# Patient Record
Sex: Female | Born: 1970 | Race: Black or African American | Hispanic: No | Marital: Single | Smoking: Current every day smoker
Health system: Southern US, Community
[De-identification: ages and names within clinical notes are randomized; demographics above are authoritative.]

## PROBLEM LIST (undated history)

## (undated) ENCOUNTER — Inpatient Hospital Stay (HOSPITAL_COMMUNITY): Payer: Self-pay

## (undated) DIAGNOSIS — O903 Peripartum cardiomyopathy: Secondary | ICD-10-CM

## (undated) DIAGNOSIS — D649 Anemia, unspecified: Secondary | ICD-10-CM

## (undated) DIAGNOSIS — H30892 Other chorioretinal inflammations, left eye: Secondary | ICD-10-CM

## (undated) DIAGNOSIS — B0052 Herpesviral keratitis: Secondary | ICD-10-CM

## (undated) DIAGNOSIS — I10 Essential (primary) hypertension: Secondary | ICD-10-CM

## (undated) HISTORY — PX: CERVICAL CERCLAGE: SHX1329

## (undated) HISTORY — PX: NO PAST SURGERIES: SHX2092

## (undated) HISTORY — PX: GASTRECTOMY: SHX58

---

## 1998-02-12 ENCOUNTER — Inpatient Hospital Stay (HOSPITAL_COMMUNITY): Admission: AD | Admit: 1998-02-12 | Discharge: 1998-02-12 | Payer: Self-pay | Admitting: *Deleted

## 1999-01-26 ENCOUNTER — Emergency Department (HOSPITAL_COMMUNITY): Admission: EM | Admit: 1999-01-26 | Discharge: 1999-01-26 | Payer: Self-pay | Admitting: Emergency Medicine

## 1999-02-10 ENCOUNTER — Other Ambulatory Visit: Admission: RE | Admit: 1999-02-10 | Discharge: 1999-02-10 | Payer: Self-pay | Admitting: Family Medicine

## 1999-08-11 ENCOUNTER — Other Ambulatory Visit: Admission: RE | Admit: 1999-08-11 | Discharge: 1999-08-11 | Payer: Self-pay | Admitting: Family Medicine

## 1999-09-28 ENCOUNTER — Emergency Department (HOSPITAL_COMMUNITY): Admission: EM | Admit: 1999-09-28 | Discharge: 1999-09-28 | Payer: Self-pay

## 1999-11-13 ENCOUNTER — Emergency Department (HOSPITAL_COMMUNITY): Admission: EM | Admit: 1999-11-13 | Discharge: 1999-11-13 | Payer: Self-pay | Admitting: Emergency Medicine

## 1999-11-13 ENCOUNTER — Encounter: Payer: Self-pay | Admitting: Emergency Medicine

## 2000-02-16 ENCOUNTER — Other Ambulatory Visit: Admission: RE | Admit: 2000-02-16 | Discharge: 2000-02-16 | Payer: Self-pay | Admitting: Family Medicine

## 2000-07-02 ENCOUNTER — Inpatient Hospital Stay (HOSPITAL_COMMUNITY): Admission: AD | Admit: 2000-07-02 | Discharge: 2000-07-02 | Payer: Self-pay | Admitting: Obstetrics & Gynecology

## 2000-07-05 ENCOUNTER — Inpatient Hospital Stay (HOSPITAL_COMMUNITY): Admission: AD | Admit: 2000-07-05 | Discharge: 2000-07-05 | Payer: Self-pay | Admitting: Obstetrics & Gynecology

## 2000-07-05 ENCOUNTER — Encounter: Payer: Self-pay | Admitting: Obstetrics

## 2000-07-26 ENCOUNTER — Inpatient Hospital Stay (HOSPITAL_COMMUNITY): Admission: AD | Admit: 2000-07-26 | Discharge: 2000-07-26 | Payer: Self-pay | Admitting: Obstetrics

## 2000-07-28 ENCOUNTER — Other Ambulatory Visit: Admission: RE | Admit: 2000-07-28 | Discharge: 2000-07-28 | Payer: Self-pay | Admitting: Obstetrics and Gynecology

## 2000-08-23 ENCOUNTER — Observation Stay (HOSPITAL_COMMUNITY): Admission: RE | Admit: 2000-08-23 | Discharge: 2000-08-23 | Payer: Self-pay | Admitting: Obstetrics and Gynecology

## 2000-08-29 ENCOUNTER — Encounter: Payer: Self-pay | Admitting: Obstetrics and Gynecology

## 2000-08-29 ENCOUNTER — Inpatient Hospital Stay (HOSPITAL_COMMUNITY): Admission: AD | Admit: 2000-08-29 | Discharge: 2000-08-29 | Payer: Self-pay | Admitting: Obstetrics and Gynecology

## 2000-10-24 ENCOUNTER — Inpatient Hospital Stay (HOSPITAL_COMMUNITY): Admission: AD | Admit: 2000-10-24 | Discharge: 2000-10-24 | Payer: Self-pay | Admitting: Obstetrics and Gynecology

## 2000-11-07 ENCOUNTER — Inpatient Hospital Stay (HOSPITAL_COMMUNITY): Admission: AD | Admit: 2000-11-07 | Discharge: 2000-11-07 | Payer: Self-pay | Admitting: Obstetrics and Gynecology

## 2000-11-25 ENCOUNTER — Encounter: Payer: Self-pay | Admitting: Obstetrics and Gynecology

## 2000-11-25 ENCOUNTER — Inpatient Hospital Stay (HOSPITAL_COMMUNITY): Admission: AD | Admit: 2000-11-25 | Discharge: 2000-11-26 | Payer: Self-pay | Admitting: Obstetrics and Gynecology

## 2000-11-26 ENCOUNTER — Encounter: Payer: Self-pay | Admitting: Obstetrics and Gynecology

## 2000-12-02 ENCOUNTER — Inpatient Hospital Stay (HOSPITAL_COMMUNITY): Admission: AD | Admit: 2000-12-02 | Discharge: 2000-12-02 | Payer: Self-pay | Admitting: Obstetrics and Gynecology

## 2000-12-26 ENCOUNTER — Inpatient Hospital Stay (HOSPITAL_COMMUNITY): Admission: AD | Admit: 2000-12-26 | Discharge: 2000-12-26 | Payer: Self-pay | Admitting: Obstetrics and Gynecology

## 2001-02-10 ENCOUNTER — Inpatient Hospital Stay (HOSPITAL_COMMUNITY): Admission: AD | Admit: 2001-02-10 | Discharge: 2001-02-10 | Payer: Self-pay | Admitting: Obstetrics and Gynecology

## 2001-02-21 ENCOUNTER — Inpatient Hospital Stay (HOSPITAL_COMMUNITY): Admission: AD | Admit: 2001-02-21 | Discharge: 2001-02-23 | Payer: Self-pay | Admitting: Obstetrics and Gynecology

## 2001-02-27 ENCOUNTER — Inpatient Hospital Stay (HOSPITAL_COMMUNITY): Admission: AD | Admit: 2001-02-27 | Discharge: 2001-03-02 | Payer: Self-pay | Admitting: Obstetrics and Gynecology

## 2001-02-27 ENCOUNTER — Encounter: Payer: Self-pay | Admitting: Obstetrics and Gynecology

## 2001-06-19 ENCOUNTER — Emergency Department (HOSPITAL_COMMUNITY): Admission: EM | Admit: 2001-06-19 | Discharge: 2001-06-19 | Payer: Self-pay | Admitting: Emergency Medicine

## 2001-06-19 ENCOUNTER — Encounter: Payer: Self-pay | Admitting: Emergency Medicine

## 2004-11-12 ENCOUNTER — Emergency Department (HOSPITAL_COMMUNITY): Admission: EM | Admit: 2004-11-12 | Discharge: 2004-11-12 | Payer: Self-pay | Admitting: Emergency Medicine

## 2005-05-18 ENCOUNTER — Inpatient Hospital Stay (HOSPITAL_COMMUNITY): Admission: AD | Admit: 2005-05-18 | Discharge: 2005-05-18 | Payer: Self-pay | Admitting: Obstetrics and Gynecology

## 2005-09-19 ENCOUNTER — Emergency Department (HOSPITAL_COMMUNITY): Admission: EM | Admit: 2005-09-19 | Discharge: 2005-09-19 | Payer: Self-pay | Admitting: Emergency Medicine

## 2005-10-19 ENCOUNTER — Other Ambulatory Visit: Admission: RE | Admit: 2005-10-19 | Discharge: 2005-10-19 | Payer: Self-pay | Admitting: Obstetrics and Gynecology

## 2005-11-29 ENCOUNTER — Inpatient Hospital Stay (HOSPITAL_COMMUNITY): Admission: AD | Admit: 2005-11-29 | Discharge: 2005-11-29 | Payer: Self-pay | Admitting: Obstetrics and Gynecology

## 2005-12-04 ENCOUNTER — Observation Stay (HOSPITAL_COMMUNITY): Admission: RE | Admit: 2005-12-04 | Discharge: 2005-12-04 | Payer: Self-pay | Admitting: Obstetrics and Gynecology

## 2006-04-22 ENCOUNTER — Inpatient Hospital Stay (HOSPITAL_COMMUNITY): Admission: AD | Admit: 2006-04-22 | Discharge: 2006-04-22 | Payer: Self-pay | Admitting: Obstetrics and Gynecology

## 2006-05-07 ENCOUNTER — Inpatient Hospital Stay (HOSPITAL_COMMUNITY): Admission: AD | Admit: 2006-05-07 | Discharge: 2006-05-10 | Payer: Self-pay | Admitting: Obstetrics and Gynecology

## 2006-05-21 ENCOUNTER — Inpatient Hospital Stay (HOSPITAL_COMMUNITY): Admission: AD | Admit: 2006-05-21 | Discharge: 2006-05-23 | Payer: Self-pay | Admitting: Obstetrics and Gynecology

## 2007-03-15 ENCOUNTER — Emergency Department (HOSPITAL_COMMUNITY): Admission: EM | Admit: 2007-03-15 | Discharge: 2007-03-15 | Payer: Self-pay | Admitting: *Deleted

## 2007-04-08 ENCOUNTER — Ambulatory Visit: Payer: Self-pay | Admitting: Unknown Physician Specialty

## 2007-04-12 ENCOUNTER — Ambulatory Visit: Payer: Self-pay | Admitting: Unknown Physician Specialty

## 2007-05-13 ENCOUNTER — Ambulatory Visit: Payer: Self-pay | Admitting: Unknown Physician Specialty

## 2007-07-13 ENCOUNTER — Encounter: Admission: RE | Admit: 2007-07-13 | Discharge: 2007-07-13 | Payer: Self-pay | Admitting: Internal Medicine

## 2008-07-27 ENCOUNTER — Encounter: Admission: RE | Admit: 2008-07-27 | Discharge: 2008-07-27 | Payer: Self-pay | Admitting: Internal Medicine

## 2009-09-16 ENCOUNTER — Encounter: Admission: RE | Admit: 2009-09-16 | Discharge: 2009-09-16 | Payer: Self-pay | Admitting: Internal Medicine

## 2010-12-07 ENCOUNTER — Emergency Department (HOSPITAL_COMMUNITY)
Admission: EM | Admit: 2010-12-07 | Discharge: 2010-12-07 | Disposition: A | Discharge status: Home / Self Care | Payer: Self-pay | Attending: Emergency Medicine | Admitting: Emergency Medicine

## 2010-12-07 DIAGNOSIS — I10 Essential (primary) hypertension: Secondary | ICD-10-CM | POA: Insufficient documentation

## 2010-12-07 DIAGNOSIS — K5289 Other specified noninfective gastroenteritis and colitis: Secondary | ICD-10-CM | POA: Insufficient documentation

## 2010-12-07 DIAGNOSIS — R000 Tachycardia, unspecified: Secondary | ICD-10-CM | POA: Insufficient documentation

## 2010-12-07 LAB — COMPREHENSIVE METABOLIC PANEL
Alkaline Phosphatase: 65 U/L (ref 39–117)
CO2: 24 mEq/L (ref 19–32)
Chloride: 102 mEq/L (ref 96–112)
Glucose, Bld: 123 mg/dL — ABNORMAL HIGH (ref 70–99)
Total Bilirubin: 0.8 mg/dL (ref 0.3–1.2)
Total Protein: 8.1 g/dL (ref 6.0–8.3)

## 2010-12-07 LAB — URINALYSIS, ROUTINE W REFLEX MICROSCOPIC
Nitrite: NEGATIVE
Urine Glucose, Fasting: NEGATIVE mg/dL

## 2010-12-07 LAB — URINE MICROSCOPIC-ADD ON

## 2010-12-07 LAB — DIFFERENTIAL
Basophils Absolute: 0 10*3/uL (ref 0.0–0.1)
Eosinophils Absolute: 0.1 10*3/uL (ref 0.0–0.7)
Eosinophils Relative: 1 % (ref 0–5)
Lymphs Abs: 0.3 10*3/uL — ABNORMAL LOW (ref 0.7–4.0)
Monocytes Absolute: 0.4 10*3/uL (ref 0.1–1.0)
Neutro Abs: 10.7 10*3/uL — ABNORMAL HIGH (ref 1.7–7.7)

## 2010-12-07 LAB — CBC
Hemoglobin: 15 g/dL (ref 12.0–15.0)
MCV: 90.1 fL (ref 78.0–100.0)
Platelets: 283 10*3/uL (ref 150–400)
RDW: 12.8 % (ref 11.5–15.5)

## 2011-02-27 NOTE — Op Note (Signed)
[facility]  Patient:    Tanya, Fry                     MRN: [id number] Proc. Date: 08/23/00 Adm. Date:  [id number] Attending:  Leonard Schwartz                           Operative Report  PREOPERATIVE DIAGNOSES:       1. A 14-week gestation.                               2. Incompetent cervix.  POSTOPERATIVE DIAGNOSES:      1. A 14-week gestation.                               2. Incompetent cervix.  OPERATION:                    McDonald cerclage.  SURGEON:                      Janine Limbo, M.D.  ASSISTANT:  ANESTHESIA:                   Spinal anesthesia.  ESTIMATED BLOOD LOSS:         25 to 30 cc.  INDICATIONS:                  [name] is a 40 year old female, gravida 3, para 0-1-1-1, who presents at 14-weeks gestation.  She has a history of a 28-week preterm delivery and that pregnancy was complicated by an incompetent cervix.  She understands the indications for this procedure and she accepts the risks of, but not limited to, anesthetic complications, bleeding, infections, and possible damage to surrounding organs.  FINDINGS:                     An ultrasound was obtained by me which showed a single intrauterine gestation in a breech presentation.  The fetal heart rate was 148 beats per minute.  The amniotic fluid volume was normal.  The placenta was posterior. The cervix was closed and long.  The patients blood type is O positive.  DESCRIPTION OF PROCEDURE:     The patient was taken to the operating room where a spinal anesthetic was given.  The perineum and vagina were prepped with multiple layers of Hibiclens.  The patient had voided 10 minutes prior to coming to the operating room.  The patient was sterilely draped.  Examination under anesthesia was performed.  A speculum was placed in the vagina.  A circumferential stitch of 5 mm Mersilene was placed around the cervix at the level of the internal os.   Care was taken not to damage the gestational sac. The knot was tied posteriorly.  The patient tolerated the procedure well.  The estimated blood loss was 25 to 30 cc.  The patient was returned to the supine position and then taken to the recovery room in stable condition.  Sponge, needle and instrument counts were correct.  FOLLOW-UP INSTRUCTIONS:       The patient was given terbutaline 0.25 mg subcutaneous prior to surgery.  She will receive ibuprofen for at least one week postoperatively.  She will be observed in the hospital and then discharged  to home later. DD:  08/23/00 TD:  08/23/00 Job: [id number]

## 2011-02-27 NOTE — H&P (Signed)
Tanya Fry, Tanya Fry              ACCOUNT NO.:  [id number]   MEDICAL RECORD NO.:  [id number]           PATIENT TYPE:   LOCATION:                                FACILITY:  [facility]   PHYSICIAN:  Janine Limbo, M.D.DATE OF BIRTH:  Feb 15, 1971   DATE OF ADMISSION:  05/07/2006  DATE OF DISCHARGE:                                HISTORY & PHYSICAL   HISTORY OF PRESENT ILLNESS:  Tanya Fry is a 40 year old female, gravida  6, para 1-1-3-2, who presents at [redacted] weeks gestation (EDC is May 14, 2006).  The patient has been followed at the [facility] and  Gynecology Division of [facility].  This pregnancy has  been complicated by the fact that she has had a pre-term delivery at 28  weeks in the past.  She has also been diagnosed with an incompetent cervix.  A cerclage was placed during the pregnancy and was removed at [redacted] weeks  gestation.  The patient also has a history of a postpartum cardiomyopathy.  She has been followed closely and has done very well during this pregnancy.  She has been evaluated by a cardiologist and her most recent echo was  thought to be within normal limits.  The patient has had an elevation of her  blood pressure in the late third trimester.  Her non-stress tests have been  reactive.  The patient has a history of postpartum depression.  She desires  permanent sterilization.  She is greater than age 11 but declined  amniocentesis.   OBSTETRICAL HISTORY:  The patient had a pre-term delivery in 1992 at [redacted]  weeks gestation.  In 2002, the patient had a term vaginal delivery.  A  cerclage was placed during that pregnancy.  She was noted to have a  postpartum cardiomyopathy.  The patient had a miscarriage in 2004.  She had  an elective pregnancy termination in 2006.   PAST MEDICAL HISTORY:  Please see history of present illness.   ALLERGIES:  No known drug allergies.  She is allergic to shellfish.   REVIEW OF SYSTEMS:  Normal  pregnancy complaints.   SOCIAL HISTORY:  The patient denies cigarette use, alcohol use, and  recreational drug use.   FAMILY HISTORY:  Noncontributory.   PHYSICAL EXAMINATION:  VITAL SIGNS:  Weight 249 pounds.  HEENT:  Within normal limits.  CHEST:  Clear.  HEART:  Regular rate and rhythm.  BREASTS:  Without masses.  ABDOMEN:  Nontender, the fundal height is approximately 39 cm.  EXTREMITIES:  Edematous.  NEUROLOGICAL:  Grossly normal.  PELVIC EXAM:  Cervix is fingertip dilated, 50% effaced, and -2 in station.   LABORATORY DATA:  Blood type O positive, antibody screen negative, sickle  cell negative, VDRL nonreactive, Rubella immune, HBSAG negative, third  trimester beta Strep is negative.   ASSESSMENT:  1.  [redacted] weeks gestation.  2.  History of an incompetent cervix.  3.  History of postpartum cardiomyopathy.  4.  Late third trimester elevation in blood pressure.  5.  Desires permanent sterilization.  6.  History of postpartum depression.  7.  History of pre-term delivery.   PLAN:  The patient will undergo induction of labor.  The patient also  desires permanent sterilization.  We will follow the patient carefully.  We  will have cardiology consult with this patient so that we can properly  manage her postpartum fluid balance.      Janine Limbo, M.D.  Electronically Signed     AVS/MEDQ  D:  05/06/2006  T:  05/06/2006  Job:  [id number]   cc:   Armanda Magic, M.D.  Fax: [phone]

## 2011-02-27 NOTE — H&P (Signed)
[facility]  Patient:    Tanya Fry, Tanya Fry                     MRN: [id number] Adm. Date:  [id number] Disc. Date: [id number] Attending:  Leonard Schwartz Dictator:   Nigel Bridgeman, C.N.M.                         History and Physical  HISTORY:                      Tanya Fry is a 40 year old, gravida 3, para 0-1-1-1 at 52 6/7 weeks who presented from the office with shortness of breath, fatigue, 10 pound weight gain in one week, dizziness and palpitations. She also reports the symptoms have been ongoing but the last few weeks have been worsening in terms of the fatigue and the shortness of breath over the last few days. She denies any cramping or bleeding. She reports positive fetal movement. She does report she has been treated for bacterial vaginosis but now feels that she is developing a yeast infection. She denies any recent illnesses or exposures. The pregnancy has been remarkable for: 1) A cerclage in place secondary to a history of previous preterm delivery at 28 weeks.  2) History of incompetent cervix. 3) Positive group B strep.  PRENATAL LABS:                Blood type is O+, Rh antibody negative. VDRL nonreactive. Rubella titer positive. Hepatitis B surface antigen negative. Hemoglobin upon entering the practice was 11.5 with platelet count of 313. EDC of Feb 24, 2001 was established by last menstrual period and was in agreement with ultrasound at approximately 18 weeks. Group B strep culture was positive in January.  HISTORY OF PRESENT PREGNANCY:  The patient entered care at approximately 10 weeks. She had had a history of a previous preterm delivery with an incompetent cervix at 28 weeks. A cerclage was placed at approximately 13.5-14 weeks. She did have some bleeding and cramping the week following the cerclage placement. She had a low lying placenta diagnosed on 13 week ultrasound. This resolved by ultrasound at 18 weeks. Her  cervix has been checked every visit and has remained intact. She had another ultrasound at 20 weeks which showed normal growth and fluid. She was treated for bacterial vaginosis with metrogel at 20 weeks.  OBSTETRICAL HISTORY:          In 1992, she had a vaginal birth of a female infant, weight 2 pounds 8 ounces at [redacted] weeks gestation. She was in labor 30 minutes to an hour. She had no anesthesia. She was in the hospital on magnesium sulfate at the time of her delivery and had been diagnosed with preterm labor beginning at 12 weeks. She was on terbutaline and magnesium sulfate. In 1999, she had elective termination of pregnancy between 8 and 12 weeks with no complications. On the previous pregnancy, she did have anemia which was treated with iron.  PAST MEDICAL HISTORY:         She is a previous contraceptive film user. She had an abnormal Pap approximately one year ago and had a normal colposcopy. Her Paps have been normal since. She has had occasional yeast infections and occasional bacterial vaginosis. She does have a history of anemia. She has occasional UTIs in the past. She had her wisdom teeth removed 15 years ago and had a  therapeutic termination of pregnancy in 1999. She was hospitalized in the past for an anaphylactic reaction to shellfish.  FAMILY HISTORY:               Her maternal grandfather had an MI. Her maternal grandfather and father had hypertension. The patient has occasional varicosities. Father has emphysema. Maternal grandmother had breast cancer. Father is a drug and alcohol user.  GENETIC HISTORY:              Unremarkable.  SOCIAL HISTORY:               The patient is single. The father of the baby is involved and supportive, his name is Georges Mouse. She is African-American of the [employer]. She is college educated. She is employed as a Animal nutritionist at [employer]. Her partner has two years of college, he is employed as a Paediatric nurse. She  has been followed by the physician service at [facility]. She denies any alcohol, drug, or tobacco use during this pregnancy. 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  PHYSICAL EXAMINATION:  VITAL SIGNS:                  Blood pressure is 130/183 and 120/170. Respirations are 18. O2 saturations are 98-99% on room air. The patient is afebrile. The patients able to lie flat without shortness of breath.  HEENT:                        Normal. There is no carotid bruit or distention noted.  HEART:                        Regular rate with a very mild occasional irregular rhythm.  ABDOMEN:                      Gravid and nontender with fundal height approximately 26-27 weeks. Electronic fetal monitoring revealed fetal heart rate is reactive with occasional and sporadic mild variables. There are 1-2 contractions and approximately 2 hours of tracing. These were mild and the patient was unaware of them.  PELVIC:                       Deferred at this time.  EXTREMITIES:                  Deep tendon reflexes are 3+ with one beat of clonus bilaterally. There is also 2+ edema noted in the lower extremities.  LABORATORY DATA:              CBC, hemoglobin is 8.5, hematocrit 23.9, white blood cell count 8.5 and platelets of 183. Comprehensive metabolic shows SGOT of 51, SGPT 67, sodium 134, potassium 3.8, BUN 5, creatinine 0.6. TSH is pending. Urinalysis at the office showed a trace protein, negative ketones. EKG done shows normal sinus rhythm with a sinus arrhythmia. There is a grade 3-4 systolic murmur noted. Negative CVA tenderness noted.  IMPRESSION:                    1. Intrauterine pregnancy at 25 6/7 weeks.                                2. Cardiac issue versus preeclampsia variant.  3. Anemia.  PLAN:                          1. Admit to ______ unit for 23 hour                                   observation per consult  with Dr. Marline Backbone as attending physician.                                2. Continuous electronic fetal monitoring.                                 3. Plan repeat CBC and complete metabolic                                   profile in the morning.                                4. Dr. Stefano Gaul or Dr. Pennie Rushing will pursue                                   cardiology consult in the morning.                                5. M.D.s will follow. DD:  11/24/00 TD:  11/24/00 Job: [id number]

## 2011-02-27 NOTE — Op Note (Signed)
[name], Tanya Fry              ACCOUNT NO.:  [id number]   MEDICAL RECORD NO.:  [id number]          PATIENT TYPE:  OBV   LOCATION:  9307                          FACILITY:  [facility]   PHYSICIAN:  Janine Limbo, M.D.DATE OF BIRTH:  10/15/1970   DATE OF PROCEDURE:  12/04/2005  DATE OF DISCHARGE:  12/04/2005                                 OPERATIVE REPORT   PREOPERATIVE DIAGNOSES:  1.  Gestation at 17 weeks.  2.  Incompetent cervix.   POSTOPERATIVE DIAGNOSES:  1.  Gestation at 17 weeks.  2.  Incompetent cervix.   PROCEDURE:  McDonald cerclage.   SURGEON:  Leonard Schwartz, M.D.   ANESTHETIC:  Spinal.   DISPOSITION:  Tanya Fry is a 39 year old female, gravida 6, para 1-1-3-  2, who presents with the above diagnoses.  She understands the indications  for her surgical procedure, and she accepts the risks of, but not limited  to, anesthetic complications, bleeding, infection, and possible damage to  surrounding organs.   FINDINGS:  The patient was noted to have an 18-week size uterus.  The cervix  was long and closed. The patient's blood type is O+.   DESCRIPTION OF PROCEDURE:  The patient was taken to the operating room where  a spinal anesthetic was given.  The patient's perineum and vagina were  prepped with multiple layers of Hibiclens.  The bladder was drained of  urine.  The patient was then sterilely draped.  The cervix was injected and  was examined.  We then used a #5 Mersilene stitch to make a cerclage around  the cervix with a knot tied posteriorly.  The patient tolerated her  procedure well. The estimated blood loss was 5 mL.  There was no evidence of  damage to the membranes. The cervix was noted to be closed and firm at the  end of our procedure.  The patient tolerated her procedure well.   FOLLOW-UP INSTRUCTIONS:  The patient will be observed in the hospital for  approximately 5-6 hours.  She will take ibuprofen at home 600 mg every 6  hours for the  next 4 days.  She was then use ibuprofen as needed.  She  was also given a prescription for Vicodin and she will take 1 or 2 tablets  every 4 hours as needed for pain.  The patient will call for questions or  concerns.  She will call if there is any evidence of excessive bleeding or  leaking of fluid.  She will return to see Dr.  Stefano Gaul in two weeks for  followup examination in the office or as needed.      Janine Limbo, M.D.  Electronically Signed     AVS/MEDQ  D:  12/04/2005  T:  12/05/2005  Job:  [id number]
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
## 2011-02-27 NOTE — H&P (Signed)
NAMEODESSER, TOURANGEAU              ACCOUNT NO.:  [id number]   MEDICAL RECORD NO.:  [id number]          PATIENT TYPE:  INP   LOCATION:  9157                          FACILITY:  [facility]   PHYSICIAN:  Osborn Coho, M.D.   DATE OF BIRTH:  08/02/1971   DATE OF ADMISSION:  05/21/2006  DATE OF DISCHARGE:                                HISTORY & PHYSICAL   [name] is a 40 year old gravida 6, para 2-1-3-3 who was admitted at  61 days postpartum with elevated blood pressures and headache.  Patient with  history of increased blood pressure during pregnancy; however, no evidence  of preeclampsia.  Patient was seen several times during the week prior to  admission for treatment of headache at her primary care Tanya Fry, at which  time her blood pressure was noted to be elevated.  Patient was also seen at  [facility] on the day of admission and noted to have elevated blood  pressures of 170s over 100s.  Patient with complaints of onset of bilateral  occipital headache on Saturday, May 15, 2006.  Patient reports the  headache as moderate.  Patient reports she has been using Motrin and Tylox  with minimal to moderate relief of her headache.  Patient also reports some  slightly blurry vision with this headache.  Patient reports positive  photophobia but no phonophobia.  Patient denies nausea and vomiting.  Patient denies any right upper quadrant pain.  Patient denies increased  edema, reports that her edema has decreased.  Patient Denies chest or leg  pain, shortness of breath.  Patient reports normal lochia.  Patient denies  perineal pain.  Patient denies any fever or UTI symptoms.  Patient was seen  by [facility] today and reports that she had a normal echo with an  ejection fraction of approximately 50%.  Patient with no previous history of  migraines.  Patient is admitted for magnesium sulfate for seizure  prophylaxis, as well as 24-hour urine and further observation.   CURRENT  MEDICATIONS:  1. Lasix 20 mg daily.  2. Prenatal vitamins, one daily.  3. Motrin 600 mg every six hours.   ALLERGIES:  NO KNOWN DRUG ALLERGIES.   PAST OB HISTORY:  1. Pregnancy #1, spontaneous vaginal delivery at 28 weeks, incompetent      cervix.  2. Pregnancy #2, elective interruption of pregnancy.  3. Pregnancy #3, spontaneous vaginal delivery at 40 weeks, complicated by      postpartum cardiomyopathy and cerclage.  4. Spontaneous AB, no complications.  5. Elective interruption of pregnancy.  6. Spontaneous vaginal delivery at 39 weeks with cerclage and increased      blood pressure.   GYN HISTORY:  Patient with a history of abnormal Pap six years ago, status  post culpo.  No abnormal Pap since.  History of chlamydia in the past.   PAST MEDICAL HISTORY:  1. Significant for postpartum cardiomyopathy.  No evidence of      cardiomyopathy on echo done prior to admission.  2. History of postpartum depression, no current signs and symptoms.   SURGERIES:  D&C x2, cerclage x2.  HOSPITALIZATIONS:  Birth x3.   FAMILY HISTORY:  Father with cardiomyopathy, coronary artery disease, COPD.  Paternal grandmother:  Coronary artery disease.  Paternal grandfather:  Coronary artery disease.  Maternal grandmother:  Breast cancer.   SOCIAL HISTORY:  Patient denies use of alcohol, tobacco or street drugs.  Patient's domestic violence screen is negative.   PHYSICAL EXAMINATION:  VITAL SIGNS:  Blood pressures on admission to MAU  170/85, 148/84, 155/75, 141/74.  Patient is afebrile.  Other vital signs are  stable.  Patient is in no apparent distress.  SKIN:  Warm and dry.  Color is satisfactory.  Patient is alert and oriented.  HEENT:  Within normal limits.  NECK:  With no JVD, negative lymphadenopathy.  LUNGS:  Clear.  HEART:  Regular rate and rhythm.  BREASTS:  Soft.  BACK:  Negative CVA tenderness.  ABDOMEN:  Soft, nontender with positive bowel sounds in all four quadrants.  Uterine  fundus is firm and nontender and appropriately involuting.  PELVIC:  Deferred.  EXTREMITIES:  Trace edema noted bilaterally in lower extremities.  Deep  tendon reflexes are 1+, no clonus.  Negative Homan's sign bilaterally.  SKIN:  Intact.  WEIGHT:  236-1/2 pounds on admission today.   ADMISSION LABS:  Clean-catch urine, specific gravity 1.010, negative  protein, small blood, small leukocyte esterase.  CBC:  WBCs 4.9, hemoglobin  12.1, hematocrit 35.9, platelets 342,000.   METABOLIC LABS:  Creatinine 1.1, BUN 11 and potassium 3.4, otherwise within  normal limits, SGOT 21, SGPT 20, LDH 125, uric acid 9.3.   ASSESSMENT:  1. Postpartum hypertension, rule out pre-eclampsia.  2. History of postpartum cardiomyopathy.  3. Headache.   PLAN:  Patient to be admitted to antepartum for magnesium seizure  prophylaxis per Dr. Su Hilt.  A 24-hour urine will be collected and repeat  labs in the morning.  Serial blood pressures and further observation.     Rhona Leavens, CNM      Osborn Coho, M.D.  Electronically Signed   NOS/MEDQ  D:  05/21/2006  T:  05/21/2006  Job:  [id number]

## 2011-02-27 NOTE — Discharge Summary (Signed)
Tanya Fry, Tanya Fry              ACCOUNT NO.:  [id number]   MEDICAL RECORD NO.:  [id number]          PATIENT TYPE:  INP   LOCATION:  9117                          [facility]ILITY[facility]  [facility]   PHYSICIAN:  Hal Morales, M.D.DATE OF BIRTH:  05/14/1971   DATE OF ADMISSION:  05/07/2006  DATE OF DISCHARGE:  05/10/2006                                 DISCHARGE SUMMARY   ADMISSION DIAGNOSES:  1. Intrauterine pregnancy at 39 weeks.  2. History of incompetent cervix.  3. History of postpartum cardiomyopathy.  4. Late third trimester elevation of blood pressure.  5. Desires permanent sterilization.  6. History of postpartum depression.  7. History of preterm delivery.   DISCHARGE DIAGNOSES:  1. Term pregnancy.  2. Gestational hypertension.  3. History of postpartum cardiomyopathy.   PROCEDURES:  1. Normal spontaneous vaginal delivery.  2. Epidural anesthesia.   HOSPITAL COURSE:  The patient was scheduled for induction due to her history  of the postpartum cardiomyopathy.  Her pregnancy had been remarkable for  placement of a cerclage that was removed at 36 weeks.  Her most recent  cardiac echocardiogram had been within normal limits.  The patient had an  elevation of her blood pressure in the late third trimester, but this did  not present itself to be a problem in labor.  On admission, the cervix was  fingertip, 50% effaced, and -2.  She was brought in on the afternoon of May 07, 2006.  Low-dose Pitocin was begun.  It was then discontinued the evening  of May 07, 2006.  Cytotec was placed q.4h.  Pitocin was begun the next  morning, and the patient began to move quickly in her labor.  She was  completely dilated at 10:57 and delivered at 11:21 a viable female by the  name of Alana, Apgars 8 and 9, weight 6 pounds 13 ounces.  There were no  lacerations noted.   The patient had originally elected sterilization.  [facility] was  consulted.  The patient was seen during her hospital  stay by the  cardiologist.  Recommendations made were to keep scheduled followup in one  week with the cardiologist.  The patient was placed on HCTZ 25 mg, one p.o.  daily.  This was designed to be continued post-delivery.   By postpartum day #1, hemoglobin was 10.1, down from 11.  The patient  continued to take her tandem iron supplements.  Social work consult was  obtained secondary to history of postpartum depression.  The patient was  doing very well.   The rest of the patient's hospital course was uncomplicated.  By postpartum  day #2, she was doing well.  She was up ad lib.  She denied any shortness of  breath or chest pain.  Her weight was 244.  She had been 250 on May 07, 2006.  Extremities were within normal limits.  The patient was not appearing  to retain extra fluid.  She was tolerating a regular diet and having good  control of her pain.  She was deemed to have received full benefit from her  hospital  stay and was discharged home.  She was breast and bottle feeding.   DISCHARGE INSTRUCTIONS:  Per [facility] handout.  The patient was  also to observe for any signs and symptoms of increased fluid retention,  shortness of breath, headache, visual symptoms, or epigastric pain or any  other abnormal findings.   DISCHARGE MEDICATIONS:  1. Motrin 600 mg p.o. q.6h. p.r.n. pain.  2. Tylox one to two p.o. q.3-4h. p.r.n. pain.  3. HCTZ 25 mg one p.o. daily.   FOLLOW UP:  Discharge followup will occur in six weeks at [facility].  The patient will be seen next week by the cardiologist or p.r.n.      Renaldo Reel Emilee Hero, C.N.M.      Hal Morales, M.D.  Electronically Signed    VLL/MEDQ  D:  05/10/2006  T:  05/10/2006  Job:  [id number]

## 2011-02-27 NOTE — Discharge Summary (Signed)
[facility] of [facility]  Patient:    Tanya Fry, Tanya Fry                     MRN: [id number] Adm. Date:  [id number] Disc. Date: 11/26/00 Attending:  Leonard Schwartz Dictator:   Miguel Dibble, C.N.M.                           Discharge Summary  DATE OF BIRTH:                20-Nov-1970  ADMISSION DIAGNOSES:          Intrauterine pregnancy at 25 6/7 weeks with shortness of breath, fatigue, and systolic ejection murmur grade 3, severe anemia, incompetent cervix, low lying placenta.  DISCHARGE DIAGNOSES:          Improvement in anemia, incompetent cervix stable.  PROCEDURE:                    Electronic fetal monitoring, transfusion of 3 units packed cells, echocardiogram, ultrasound, pulse oximetry, EKG.  HOSPITAL COURSE:              On February 13 Marciana Uplinger was admitted from the office a gravida 3, para 0-1-1-1 at 42 6/7 weeks with severe dyspnea, fatigue, 10 pounds weight gain in the previous week, dizziness, palpitations. She was noted to have grade 3 systolic ejection murmur, severe 2+ edema pitting in the lower extremities.  No carotid enlargement.  Lung sounds were clear.  EKG indicated a normal sinus rhythm.  She was evaluated by the cardiologist who ruled out congestive heart failure and it was assumed that her shortness of breath was most likely due to severe anemia-hemoglobin 7.5, hematocrit 21.4, platelets 207,000, white count 7.9.  Liver enzymes were elevated.  AST was 51, ALT 67.  Hemoglobin dropped from 8.5 to 7.5. Hematocrit dropped from 23.9 to 21.4.  Vital signs were stable.  O2 saturation at 97%.  TSH 1.5.  Patient was offered risks and benefits of a blood transfusion and consented to a blood transfusions.  She received 3 units of packed cells with an improvement in her hemoglobin to 10.1 the day after her transfusion February 15.  Vital signs were stable.  Blood pressures ranged 114-120/62-70.  SGOT had decreased to normal 30,  SGPT 46 with upper limit of normal 40.  Her shortness of breath had decreased since her transfusion. Patient was encouraged to continue taking her prenatal vitamins and iron supplementation t.i.d.  She would follow-up in the office next week.  DISCHARGE INSTRUCTIONS:       Decreased activity, kick counts, antepartum instructions.  DISCHARGE MEDICATIONS:        1. Hemocyte t.i.d.                               2. Prenatal vitamins.  DISCHARGE FOLLOW-UP:          In one week at [facility]. DD:  11/26/00 TD:  11/26/00 Job: [id number]

## 2011-02-27 NOTE — H&P (Signed)
[facility] of [facility]  Patient:    Tanya Fry, Tanya Fry                       MRN: [id number] Attending:  Janine Limbo, M.D.                         History and Physical  DATE OF BIRTH:                23-Dec-1970  HISTORY OF PRESENT ILLNESS:   Ms. Kelson is a 40 year old female, gravida 3, para 0-1-1-1, who presents at [redacted] weeks gestation ([facility] Feb 24, 2001) for a cerclage.  The patient had a prior delivery at [redacted] weeks gestation preceded by a silent dilatation of her cervix.  She was told by her physician at that time that she had an incompetent cervix and that a cerclage would be appropriate.  OBSTETRICAL HISTORY:           The patient had a vaginal delivery in 1992 at [redacted] weeks gestation.  She had an elective pregnancy termination in 1999.  PAST MEDICAL HISTORY:         The patient denies hypertension and diabetes. She had her wisdom teeth removed in 1986.  She had an anaphylactic reaction to shellfish that required hospitalization.  DRUG ALLERGIES:               No known drug allergies, but the patient did have an anaphylactic reaction to SHELLFISH.  SOCIAL HISTORY:               The patient is single, and she works as a Designer, jewellery for [employer].  She denies cigarette use, alcohol use, and recreational drug use.  REVIEW OF SYSTEMS:            Normal pregnancy complaints.  FAMILY HISTORY:               The patients maternal grandmother had breast cancer.  The patients father had difficulty with drug and alcohol dependency. The patients father had chronic hypertension.  PHYSICAL EXAMINATION:   VITAL SIGNS:                 Weight 172 pounds.  HEENT:                        Within normal limits.  CHEST:                        Clear.  HEART:                        Regular rate and rhythm.  BREASTS:                      Without masses.  ABDOMEN:                      Nontender.  EXTREMITIES:                  Within normal  limits.  NEUROLOGIC:                   Normal.  PELVIC:                       Cervix closed and long.  ASSESSMENT:  1. Fourteen-week gestation.                               2. History of incompetent cervix.                               3. Shellfish allergy.  PLAN:                         The patient will undergo a McDonald cerclage. She understands the indications for her procedure, and she accepts the risk of, but not limited to, anesthetic complications, bleeding, infection, and possible damage to surrounding organs which may result in rupture of membranes and subsequent miscarriage.  We will use Hibiclens because the patient has an allergic reaction to shellfish. DD:  08/22/00 TD:  08/22/00 Job: [id number]

## 2011-02-27 NOTE — Discharge Summary (Signed)
[facility] of [facility]  Patient:    Tanya Fry, Tanya Fry                     MRN: [id number] Adm. Date:  [id number] Disc. Date: [id number] Attending:  Leonard Schwartz Dictator:   Mack Guise, C.N.M.                           Discharge Summary  HOSPITAL COURSE:  Ms. Babler is a 40 year old African-American female who presents status post normal spontaneous vaginal delivery on 02/21/01, with shortness of breath and chest pain with deep breathing.  She reports that she sweats all the time and now had dizziness and blurred vision.  She was hospitalized during her pregnancy in February with complaint of shortness of breath.  She was found to be anemic, hemoglobin 7.5, and she was transfused with four units of packed cells.  Her symptoms resolved at that time.  On exam on admission, her blood pressure remained elevated at 175/102, respirations 22, and pulse 66.  Her O2 saturations were 89 and 96%.  A cardiology consult was obtained, and a chest x-ray and EKG.  Her laboratory work found her liver functions, SGOT and SGPT, were elevated on admission at 52 and 58, increased to 85 and 124 and have decreased as of yesterday to 46 and 104.  Her other laboratory work remains stable.  Her hemoglobin was 9.2 with no proteinuria. Portable chest x-ray found cardiomegaly with mild pulmonary edema, some atelectasis in the right lung base and no evidence of pneumothorax or significant pleural effusion.  Echocardiogram indicated the left ventricle was mildly to moderately dilated, overall left ventricular systolic function was markedly to moderately decreased.  Left ventricular ejection fraction was estimated the range being 25-35%.  There was moderate diffuse left ventricular hypokinesis.  Findings were suggestive of akinesis of the mid distal anteroseptal wall.  Findings were suggestive of akinesis of the basal mid septal wall.  There was trivial aortic valvular  regurgitation, B-notch, consistent with elevated left ventricular end diastolic pressure.  There was moderate mitral valvular regurgitation and JCT was eccentric and directed posteriorly.  The left atrium was mildly dilated.  The right ventricle was mildly dilated.  The right atrium was moderately dilated.  There was a trivial effusion posterior to the heart.  The patient has been maintained since that time with cardiac medications and has improved her condition significantly. She is no longer short of breath.  Her swelling has decreased and she is feeling much better.  She is taking K-Dur 20 mEq 1 p.o. q.d., Lasix 40 mg 1 p.o. q.d., Altace 5 mg 1 p.o. q.d., and digoxin 0.125 mg 1 p.o. q.d.  She has been released from cardiology and is okay from an obstetrical standpoint.  DISCHARGE MEDICATIONS:  Will be those described above.  DISCHARGE FOLLOW-UP:  With cardiology on Tuesday, June 11, with Dr. Mayford Knife at 10:30, and routine postpartum visit at six weeks at [facility]. DD:  03/02/01 TD:  03/02/01 Job: [id number]

## 2011-02-27 NOTE — Discharge Summary (Signed)
[name], Fry              ACCOUNT NO.:  [id number]   MEDICAL RECORD NO.:  [id number]          PATIENT TYPE:  OBV   LOCATION:  9307                          [facility]ACILITY:  [facility]   PHYSICIAN:  Janine Limbo, M.D.DATE OF BIRTH:  26-Oct-1970   DATE OF ADMISSION:  12/04/2005  DATE OF DISCHARGE:  12/04/2005                                 DISCHARGE SUMMARY   DISCHARGE DIAGNOSES:  1.  Incompetent cervix.  2.  17 weeks' gestation.   OPERATION:  On the day of admission, the patient underwent a McDonald  cerclage tolerating the procedure well.   HISTORY OF PRESENT ILLNESS:  Tanya Fry is a 40 year old female, para 1-1-  3-2, with a history of an incompetent cervix who presents at 3 weeks'  gestation for placement of a cerclage.  Please see the patient's dictated  history and physical examination for details.   PREOPERATIVE PHYSICAL EXAM:  Weight 214 pounds, height 5 feet 7 inches tall.  General exam was within normal limits.  We do note that her abdominal exam  reveals a gravid abdomen.  Pelvic exam external genitalia is normal, vagina  is normal, cervix is closed and long.  Uterus is gravid and approximately 18-  20 weeks in size.  Adnexa was without masses.  The patient's blood type is  O+.   HOSPITAL COURSE:  On the day of admission, the patient underwent  aforementioned procedure tolerating it well and was without any  postoperative complications and therefore was discharged six hours after  procedure to go home.   DISCHARGE MEDICATIONS:  Vicodin 1-2 tablets every 4 hours as needed for  severe pain, ibuprofen 300 mg 2 tablets with food every 6 hours for 3 days  and then as needed for pain.   FOLLOW UP:  The patient is to call [facility] at [phone] to  schedule a two-week follow-up visit with Dr. Stefano Gaul.   DISCHARGE INSTRUCTIONS:  The patient was advised to avoid driving for one  week, heavy lifting for three to four weeks, sexual activity for two  weeks.  She should rest in bed and a chair for four days.  May shower and diet is  without restriction.  Final pathology is not applicable.     Tanya J. Tanya Fry.      Janine Limbo, M.D.  Electronically Signed   EJP/MEDQ  D:  01/07/2006  T:  01/08/2006  Job:  [id number]

## 2011-02-27 NOTE — Discharge Summary (Signed)
Tanya Fry, Tanya Fry              ACCOUNT NO.:  [id number]   MEDICAL RECORD NO.:  [id number]          PATIENT TYPE:  INP   LOCATION:  9157                          [facility]  [facility]   PHYSICIAN:  Osborn Coho, M.D.   DATE OF BIRTH:  1971/09/15   DATE OF ADMISSION:  05/21/2006  DATE OF DISCHARGE:  05/23/2006                                 DISCHARGE SUMMARY   ADMITTING DIAGNOSES:  1. Postpartum 14 days.  2. Postpartum hypertension.  3. History of postpartum cardiomyopathy.   DISCHARGE DIAGNOSES:  1. Sixteen days postpartum.  2. Pregnancy-induced hypertension.  No evidence of preeclampsia.  3. History of postpartum cardiomyopathy.   PROCEDURES:  Magnesium sulfate seizure prophylaxis.   HOSPITAL COURSE:  [name] was a 40 year old gravida 6, para 2-1-3-3  who was admitted at 15 days postpartum with elevated blood pressure and  complaints of headache.  The patient admitted with headache, elevated blood  pressure and slightly blurry vision.  However the patient denies right upper  quadrant pain.  The patient with slightly elevated blood pressures during  the last trimester of her pregnancy, however, during her postpartum stay and  recovery, no increased blood pressures were noted and PIH labs were  negative.  The patient reported that on six days prior to admission she  began developing bilateral occipital headache.  The patient was seen at  [facility] for follow up of history of postpartum cardiomyopathy and  blood pressures were noted to be 170's/110's.  Therefore patient was sent  for further evaluation.  The patient was admitted to [facility].  Admission labs revealed normal liver functions and metabolic  panel within normal limits with the exception of creatinine of 1.1 and uric  acid of 9.3.  Otherwise, the patient's labs were within normal limits.  The  patient's blood pressures upon admission noted to be 140's/170's to 70's to  100's.  The  patient was started on a course of IV magnesium sulfate for  seizure prophylaxis,  24-hour urine was obtained which returned with results  of protein, too small to calculate.  The patient was also started on  Labetalol p.o. for control of blood pressure.  Her blood pressures improved  on labetalol.  The patient underwent a CT of the head secondary to come  complaints of headache which returned with no intracranial processes.  The  patient remained stable throughout hospitalization.   On hospital day 2, it was that the patient has a received a full benefit of  her postpartum stay and was discharged home.   DISCHARGE CONDITION:  Stable.   DISCHARGE INSTRUCTIONS:  The patient to maintain routine postpartum  instructions as previously instructed at postpartum discharge.   DISCHARGE MEDICATIONS:  1. Labetalol 200 mg p.o. b.i.d.  2. Vicodin 1-2 tablets every 4-6 hours p.r.n. headache.  3. Flexeril 10 mg every 6-8 hours p.r.n. headache.  4. The patient will also continue with Lasix 20 mg daily.  5. Prenatal vitamins 1 tablet daily.  6. Motrin 600 mg p.o. p.r.n.   DISCHARGE FOLLOWUP:  1. The patient will return to [facility] in 1 week for blood      pressure follow up.  The patient will call to get scheduled      appointment.  2. The patient is to call her ophthalmologist for appointment as soon as      possible for evaluation secondary to continued blurry vision.      Rhona Leavens, CNM      Osborn Coho, M.D.  Electronically Signed    NOS/MEDQ  D:  05/23/2006  T:  05/24/2006  Job:  [id number]

## 2011-02-27 NOTE — Consult Note (Signed)
[facility] of [facility]  Patient:    Tanya Fry, Tanya Fry                     MRN: [id number] Adm. Date:  [id number] Disc. Date: [id number] Attending:  Shaune Spittle CC:         Tanya Fry, M.D.  Armanda Magic, M.D.  Stacie Acres Cliffton Asters, M.D.   Consultation Report  CARDIOLOGY CONSULTATION  CHIEF COMPLAINT:              Shortness of breath.  HISTORY OF PRESENT ILLNESS:   Tanya Fry is a 40 year old black female, previously healthy. She is status post vaginal delivery six days ago. She now presents with a two-day history of shortness of breath and lower extremity edema, right greater than left. She has had no chest pain, although she does complain of some discomfort in her mid back. She has had no cough, fever, or chills, no palpitations. Of note, the patient was evaluated by Dr. Armanda Magic in February of 2002 with shortness of breath. She had a cardiac evaluation including an ECG, chest x-ray, and echocardiogram, all of which were reported as normal. The only finding at that time was anemia. The patient denies any orthopnea or PND.  PAST MEDICAL HISTORY:         She has had two uncomplicated deliveries. History of cervical prolapse. She has no history of hypertension, diabetes, or hyperlipidemia.  MEDICATIONS:                  Multivitamin daily.  ALLERGIES:                    No known drug allergies.  SOCIAL HISTORY:               Noncontributory.  FAMILY HISTORY:               Noncontributory.  HABITS:                       She denies any history of tobacco use.  PHYSICAL EXAMINATION:  GENERAL:                      Patient is a young, black female in no apparent distress.  VITAL SIGNS:                  Blood pressure ranges from 155-180 systolic, 95 to 102 diastolic. Pulse is 61, respirations 18, temperature is afebrile.  HEENT:                        Unremarkable.  NECK:                         No JVD or bruits. No  thyromegaly.  LUNGS:                        Clear.  CARDIAC:                      Regular rate and rhythm without gallops, murmurs, rubs, or clicks.  ABDOMEN:                      Obese, soft, nontender.  EXTREMITIES:                  2+ right  lower extremity edema. 1+ left lower extremity edema. There is no evidence of phlebitis or tenderness.  LABORATORY DATA:              ECG shows normal sinus rhythm and normal ECG. Urinalysis is normal. Uric acid is 6.7, LDH is 190, sodium 143, potassium 4.1, chloride 108, CO2 25, BUN 13, creatinine 0.8, glucose 79, AST 52, ALT 58, alkaline phosphatase 193. White count 6900, hemoglobin 9.2, hematocrit 27.5, platelets 327,000.  Chest x-ray: AP film shows cardiomegaly with some right lower lobe atelectasis.  IMPRESSION:                   1. Dyspnea. This is fairly nonspecific.                                  Physical exam is unremarkable. This may be                                  related to anemia and/or atelectasis.                               2. Lower extremity edema, five days post                                  vaginal delivery.                               3. Cardiomegaly by chest x-ray. I suspect this                                  is exacerbated by anteroposterior technique,                                  especially in light of the normal cardiac                                  exam and the fact that she had a normal                                  echocardiogram three months ago.                               4. Anemia.                               5. Elevated alanine aminotransferase and                                  aspartate aminotransferase. This is unchanged  from February of 2002.                               6. Elevated blood pressure.  PLAN:                         Will treat at this point with Lasix 40 mg daily and potassium 20 mEq daily for the next seven days. This should  resolved he edema and I would expect that her blood pressure will improve with this. I have instructed her to avoid sodium intake. Recommended followup with Dr. Mayford Knife this week. Would consider a followup PA and lateral chest x-ray at that time. If blood pressure remains elevated, may need antihypertensive therapy. DD:  02/27/01 TD:  02/27/01 Job: [id number]

## 2011-05-14 ENCOUNTER — Other Ambulatory Visit: Payer: Self-pay | Admitting: Internal Medicine

## 2011-05-14 DIAGNOSIS — Z1231 Encounter for screening mammogram for malignant neoplasm of breast: Secondary | ICD-10-CM

## 2011-07-30 LAB — COMPREHENSIVE METABOLIC PANEL
ALT: 20
BUN: 12
CO2: 26
Calcium: 8.6
Chloride: 99
Creatinine, Ser: 0.73
GFR calc Af Amer: >60
GFR calc non Af Amer: >60
Glucose, Bld: 110 — ABNORMAL HIGH
Total Protein: 7.3

## 2011-07-30 LAB — URINALYSIS, ROUTINE W REFLEX MICROSCOPIC
Glucose, UA: NEGATIVE
Hgb urine dipstick: NEGATIVE
Specific Gravity, Urine: 1.035 — ABNORMAL HIGH
Urobilinogen, UA: 0.2
pH: 5.5

## 2011-07-30 LAB — DIFFERENTIAL
Basophils Absolute: 0
Eosinophils Absolute: 0.1
Lymphs Abs: 0.5 — ABNORMAL LOW
Monocytes Relative: 2 — ABNORMAL LOW

## 2011-07-30 LAB — CBC
HCT: 41.1
MCHC: 33.8
WBC: 9.1

## 2012-10-12 NOTE — L&D Delivery Note (Signed)
Delivery Note At 5:42 AM a viable and healthy female was delivered via Vaginal, Spontaneous Delivery (Presentation: ; Occiput Anterior).  APGAR: 8, 9; weight pending.  Infant was placed onto maternal abdomen skin-to-skin with spontaneous cry. Cord was clamped x2 and cut by aunt. Active management of 3rd stage with gentle cord traction and pitocin. Placenta status: spontaneous, intact.  Cord: 3 vessels with the following complications: None. No lacerations. Hemostasis was achieved prior to leaving the room.  Anesthesia: Epidural  Episiotomy: None Lacerations: none Suture Repair: n/a Est. Blood Loss (mL): 250  Mom to postpartum.  Baby to Couplet care / Skin to Skin.  Pior, Jearld Lesch 10/03/2013, 6:02 AM  I was present for and supervised the delivery of this newborn. I agree with above documentation by the resident.   Xiomar Crompton, Redmond Baseman, MD

## 2013-01-05 ENCOUNTER — Encounter (HOSPITAL_COMMUNITY): Payer: Self-pay | Admitting: Emergency Medicine

## 2013-01-05 ENCOUNTER — Emergency Department (HOSPITAL_COMMUNITY): Payer: Medicaid Other

## 2013-01-05 ENCOUNTER — Emergency Department (HOSPITAL_COMMUNITY)
Admission: EM | Admit: 2013-01-05 | Discharge: 2013-01-05 | Disposition: A | Discharge status: Home / Self Care | Payer: Medicaid Other | Attending: Emergency Medicine | Admitting: Emergency Medicine

## 2013-01-05 DIAGNOSIS — F17200 Nicotine dependence, unspecified, uncomplicated: Secondary | ICD-10-CM | POA: Insufficient documentation

## 2013-01-05 DIAGNOSIS — R002 Palpitations: Secondary | ICD-10-CM | POA: Insufficient documentation

## 2013-01-05 DIAGNOSIS — R0602 Shortness of breath: Secondary | ICD-10-CM | POA: Insufficient documentation

## 2013-01-05 DIAGNOSIS — I10 Essential (primary) hypertension: Secondary | ICD-10-CM | POA: Insufficient documentation

## 2013-01-05 DIAGNOSIS — E876 Hypokalemia: Secondary | ICD-10-CM | POA: Insufficient documentation

## 2013-01-05 DIAGNOSIS — R42 Dizziness and giddiness: Secondary | ICD-10-CM | POA: Insufficient documentation

## 2013-01-05 DIAGNOSIS — Z79899 Other long term (current) drug therapy: Secondary | ICD-10-CM | POA: Insufficient documentation

## 2013-01-05 DIAGNOSIS — R209 Unspecified disturbances of skin sensation: Secondary | ICD-10-CM | POA: Insufficient documentation

## 2013-01-05 DIAGNOSIS — R112 Nausea with vomiting, unspecified: Secondary | ICD-10-CM | POA: Insufficient documentation

## 2013-01-05 HISTORY — DX: Essential (primary) hypertension: I10

## 2013-01-05 LAB — CBC WITH DIFFERENTIAL/PLATELET
HCT: 40.3 % (ref 36.0–46.0)
Lymphocytes Relative: 11 % — ABNORMAL LOW (ref 12–46)
Lymphs Abs: 1 10*3/uL (ref 0.7–4.0)
MCH: 30.1 pg (ref 26.0–34.0)
MCHC: 33.3 g/dL (ref 30.0–36.0)
MCV: 90.6 fL (ref 78.0–100.0)
Monocytes Relative: 3 % (ref 3–12)
Neutro Abs: 8 10*3/uL — ABNORMAL HIGH (ref 1.7–7.7)
Neutrophils Relative %: 86 % — ABNORMAL HIGH (ref 43–77)
Platelets: 250 10*3/uL (ref 150–400)
RDW: 12.5 % (ref 11.5–15.5)
WBC: 9.3 10*3/uL (ref 4.0–10.5)

## 2013-01-05 LAB — COMPREHENSIVE METABOLIC PANEL
ALT: 15 U/L (ref 0–35)
AST: 14 U/L (ref 0–37)
Alkaline Phosphatase: 53 U/L (ref 39–117)
Calcium: 9.1 mg/dL (ref 8.4–10.5)
GFR calc Af Amer: 79 mL/min — ABNORMAL LOW (ref >90)
Glucose, Bld: 118 mg/dL — ABNORMAL HIGH (ref 70–99)
Sodium: 135 mEq/L (ref 135–145)
Total Protein: 7.7 g/dL (ref 6.0–8.3)

## 2013-01-05 LAB — URINALYSIS, ROUTINE W REFLEX MICROSCOPIC
Glucose, UA: NEGATIVE mg/dL
Hgb urine dipstick: NEGATIVE
Specific Gravity, Urine: 1.028 (ref 1.005–1.030)
Urobilinogen, UA: 0.2 mg/dL (ref 0.0–1.0)
pH: 5.5 (ref 5.0–8.0)

## 2013-01-05 LAB — PREGNANCY, URINE: Preg Test, Ur: NEGATIVE

## 2013-01-05 LAB — D-DIMER, QUANTITATIVE: D-Dimer, Quant: <0.27 ug/mL-FEU (ref 0.00–0.48)

## 2013-01-05 MED ORDER — POTASSIUM CHLORIDE CRYS ER 20 MEQ PO TBCR
40.0000 meq | EXTENDED_RELEASE_TABLET | Freq: Once | ORAL | Status: AC
  Administered 2013-01-05: 40 meq via ORAL
  Filled 2013-01-05: qty 2

## 2013-01-05 NOTE — ED Notes (Signed)
Pt states that she had an episode approx. long where her heart was pounding with palpitations, numbness in her hands, and n/v. States she has had it happen before but it subsided so she never came in. Alert and oriented.

## 2013-01-05 NOTE — ED Notes (Signed)
Bed:WA09<BR> Expected date:<BR> Expected time:<BR> Means of arrival:<BR> Comments:<BR> Triage 1

## 2013-01-05 NOTE — ED Provider Notes (Signed)
History     CSN: [id number]  Arrival date & time 01/05/13  0119   First MD Initiated Contact with Patient 01/05/13 0145      Chief Complaint  Patient presents with  . Palpitations    (Consider location/radiation/quality/duration/timing/severity/associated sxs/prior treatment) HPI Comments: Patient presents after episode of palpitations with bilateral numbness and tingling in her hands, nausea and vomiting that lasted about 10 minutes. Symptoms now resolved and she feels back to normal. Denies any chest pain or shortness of breath. Had a similar episode several years ago that resolved on its own. No history of panic attack or anxiety. 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 Denies any leg pain or swelling. No abdominal pain. No change in medication. No recent excessive caffeine use.  The history is provided by the patient.    Past Medical History  Diagnosis Date  . Hypertension     History reviewed. No pertinent past surgical history.  No family history on file.  History  Substance Use Topics  . Smoking status: Current Every Day Smoker -- 0.50 packs/day  . Smokeless tobacco: Not on file  . Alcohol Use: Yes    OB History   Grav Para Term Preterm Abortions TAB SAB Ect Mult Living                  Review of Systems  Constitutional: Negative for fever, activity change and appetite change.  HENT: Negative for congestion and rhinorrhea.   Respiratory: Negative for cough, chest tightness, shortness of breath and wheezing.   Cardiovascular: Positive for palpitations.  Gastrointestinal: Positive for nausea and vomiting. Negative for abdominal pain.  Genitourinary: Negative for dysuria, hematuria, vaginal bleeding and vaginal discharge.  Musculoskeletal: Negative for back pain.  Skin: Negative for rash.  Neurological: Positive for dizziness and light-headedness. Negative for headaches.  A complete 10 system review of systems was obtained and all  systems are negative except as noted in the HPI and PMH.    Allergies  Shellfish allergy  Home Medications   Current Outpatient Rx  Name  Route  Sig  Dispense  Refill  . acetaminophen (TYLENOL) 325 MG tablet   Oral   Take 650 mg by mouth every 6 (six) hours as needed for pain. For pain         . hydrochlorothiazide (HYDRODIURIL) 25 MG tablet   Oral   Take 25 mg by mouth daily.           BP 123/72  Pulse 85  Temp(Src) 97.4 F (36.3 C) (Axillary)  SpO2 100%  LMP 01/05/2013  Physical Exam  Constitutional: She is oriented to person, place, and time. She appears well-developed and well-nourished. No distress.  HENT:  Head: Normocephalic and atraumatic.  Mouth/Throat: Oropharynx is clear and moist. No oropharyngeal exudate.  Eyes: Conjunctivae and EOM are normal. Pupils are equal, round, and reactive to light.  Neck: Normal range of motion. Neck supple.  Cardiovascular: Normal rate, regular rhythm and normal heart sounds.   No murmur heard. Pulmonary/Chest: Effort normal and breath sounds normal. No respiratory distress.  Abdominal: Soft. There is no tenderness. There is no rebound and no guarding.  Musculoskeletal: Normal range of motion. She exhibits no edema and no tenderness.  Neurological: She is alert and oriented to person, place, and time. No cranial nerve deficit. She exhibits normal muscle tone. Coordination normal.  Skin: Skin is warm.    ED Course  Procedures (including critical care time)  Labs Reviewed  CBC WITH  DIFFERENTIAL - Abnormal; Notable for the following:    Neutrophils Relative 86 (*)    Neutro Abs 8.0 (*)    Lymphocytes Relative 11 (*)    All other components within normal limits  COMPREHENSIVE METABOLIC PANEL - Abnormal; Notable for the following:    Potassium 2.8 (*)    Glucose, Bld 118 (*)    GFR calc non Af Amer 68 (*)    GFR calc Af Amer 79 (*)    All other components within normal limits  TROPONIN I  D-DIMER, QUANTITATIVE   URINALYSIS, ROUTINE W REFLEX MICROSCOPIC  PREGNANCY, URINE  TSH   Dg Chest 2 View  01/05/2013  *RADIOLOGY REPORT*  Clinical Data: Short of breath.  Palpitations.  CHEST - 2 VIEW  Comparison: None.  Findings:  Cardiopericardial silhouette within normal limits. Mediastinal contours normal. Trachea midline.  No airspace disease or effusion. Monitoring leads are projected over the chest.  IMPRESSION: No active cardiopulmonary disease.   Original Report Authenticated By: Andreas Newport, M.D.      1. Palpitations   2. Hypokalemia       MDM  Similar episode of palpitations, numbness in the hands, nausea and vomiting. No chest pain or shortness of breath. Symptoms now resolved back to baseline.  Nonfocal exam. No distress. Mild hypokalemia noted and repleted. Normal sinus rhythm EKG, no noted arrhythmias.  No palpitations in the ED. Will refer to cardiology for possible Holter monitor testing. Likely component of anxiety versus panic attack. No evidence of ACS or PE.     Date: 01/05/2013  Rate: 75  Rhythm: normal sinus rhythm  QRS Axis: normal  Intervals: normal  ST/T Wave abnormalities: normal  Conduction Disutrbances:none  Narrative Interpretation:   Old EKG Reviewed: unchanged      Glynn Octave, MD 01/05/13 [phone]

## 2013-07-13 ENCOUNTER — Encounter (HOSPITAL_COMMUNITY): Payer: Self-pay

## 2013-07-13 ENCOUNTER — Inpatient Hospital Stay (HOSPITAL_COMMUNITY)
Admission: AD | Admit: 2013-07-13 | Discharge: 2013-07-14 | Disposition: A | Discharge status: Home / Self Care | Payer: Medicaid Other | Source: Ambulatory Visit | Attending: Obstetrics and Gynecology | Admitting: Obstetrics and Gynecology

## 2013-07-13 DIAGNOSIS — O09529 Supervision of elderly multigravida, unspecified trimester: Secondary | ICD-10-CM | POA: Insufficient documentation

## 2013-07-13 DIAGNOSIS — O0930 Supervision of pregnancy with insufficient antenatal care, unspecified trimester: Secondary | ICD-10-CM | POA: Insufficient documentation

## 2013-07-13 DIAGNOSIS — R109 Unspecified abdominal pain: Secondary | ICD-10-CM | POA: Insufficient documentation

## 2013-07-13 DIAGNOSIS — Z3483 Encounter for supervision of other normal pregnancy, third trimester: Secondary | ICD-10-CM

## 2013-07-13 DIAGNOSIS — B9689 Other specified bacterial agents as the cause of diseases classified elsewhere: Secondary | ICD-10-CM

## 2013-07-13 DIAGNOSIS — N760 Acute vaginitis: Secondary | ICD-10-CM

## 2013-07-13 DIAGNOSIS — O99891 Other specified diseases and conditions complicating pregnancy: Secondary | ICD-10-CM | POA: Insufficient documentation

## 2013-07-13 DIAGNOSIS — A499 Bacterial infection, unspecified: Secondary | ICD-10-CM

## 2013-07-13 DIAGNOSIS — R51 Headache: Secondary | ICD-10-CM | POA: Insufficient documentation

## 2013-07-13 HISTORY — DX: Anemia, unspecified: D64.9

## 2013-07-13 HISTORY — DX: Peripartum cardiomyopathy: O90.3

## 2013-07-13 LAB — URINALYSIS, ROUTINE W REFLEX MICROSCOPIC
Ketones, ur: 15 mg/dL — AB
Nitrite: NEGATIVE
pH: 6 (ref 5.0–8.0)

## 2013-07-13 LAB — DIFFERENTIAL
Lymphocytes Relative: 21 % (ref 12–46)
Monocytes Absolute: 0.6 10*3/uL (ref 0.1–1.0)
Monocytes Relative: 6 % (ref 3–12)
Neutro Abs: 6.6 10*3/uL (ref 1.7–7.7)

## 2013-07-13 LAB — WET PREP, GENITAL

## 2013-07-13 LAB — CBC
HCT: 28.3 % — ABNORMAL LOW (ref 36.0–46.0)
Hemoglobin: 9.5 g/dL — ABNORMAL LOW (ref 12.0–15.0)
WBC: 9.2 10*3/uL (ref 4.0–10.5)

## 2013-07-13 LAB — URINE MICROSCOPIC-ADD ON

## 2013-07-13 MED ORDER — OXYCODONE-ACETAMINOPHEN 5-325 MG PO TABS: 1.0000 | ORAL_TABLET | Freq: Four times a day (QID) | ORAL | Status: DC | PRN

## 2013-07-13 MED ORDER — METRONIDAZOLE 500 MG PO TABS: 500.0000 mg | ORAL_TABLET | Freq: Two times a day (BID) | ORAL | Status: DC

## 2013-07-13 MED ORDER — BUTALBITAL-APAP-CAFFEINE 50-325-40 MG PO TABS
1.0000 | ORAL_TABLET | ORAL | Status: DC | PRN
  Administered 2013-07-13: 1 via ORAL
  Filled 2013-07-13: qty 1

## 2013-07-13 NOTE — MAU Note (Signed)
42yo, G8P3 at [redacted]w[redacted]d by LMP only, presents to MAU with c/o HA x 2 days and intermittent lower abdominal pressure x 2 days. Reports +FM; denies VB, LOF, contractions. No PNC.  Last dose 800mg  ibuprofen at 1600; 1000 mg Tylenol at 0930 today.

## 2013-07-13 NOTE — MAU Note (Signed)
Patient states she has had a positive home pregnancy test. Has had a bad headache today. Started having abdominal pain yesterday, worse today and worse with movement. Had light spotting yesterday. Patient states she has felt fetal movement.

## 2013-07-13 NOTE — MAU Provider Note (Signed)
@MAUPATCONTACT @  Chief Complaint:  Possible Pregnancy, Headache and Abdominal Pain   ZANITA MILLMAN is  42 y.o. [phone] at [redacted]w[redacted]d presents complaining of Possible Pregnancy, Headache and Abdominal Pain  Patient reports a headache that started 2 days ago, along with some congestion. No fevers, nausea, vomit or diarrhea. Mild suprapubic pain that extends bilaterally. She has noticed some increase in urinary frequency, but denies dysuria, vaginal discharge, bleeding or irritation. She also denies gush of fluid, but believes she is leaking urine on occasions.  She has taken tylenol and advil for her headache today without relief.   Of note, no one in her family knows she is pregnant. She was unsure if she wanted to keep her baby, but since has decided to keep it. The FOB is aware of pregnancy and is involved in mothers life. She has a positive home pregnancy test. Her LMP was 01/03/2013 and she believes her dates are good. She has had no PNC with this pregnancy.   Obstetrical/Gynecological History: OB History   Grav Para Term Preterm Abortions TAB SAB Ect Mult Living   8 3 2 1 4  4   3      Past Medical History: Past Medical History  Diagnosis Date  . Hypertension   . Anemia   . Postpartum cardiomyopathy     Past Surgical History: Past Surgical History  Procedure Laterality Date  . No past surgeries      Family History: History reviewed. No pertinent family history.  Social History: History  Substance Use Topics  . Smoking status: Current Every Day Smoker -- 0.50 packs/day  . Smokeless tobacco: Not on file  . Alcohol Use: Yes    Allergies:  Allergies  Allergen Reactions  . Shellfish Allergy Anaphylaxis    Meds:  Prescriptions prior to admission  Medication Sig Dispense Refill  . acetaminophen (TYLENOL) 325 MG tablet Take 650 mg by mouth every 6 (six) hours as needed for pain. For pain      . diphenhydrAMINE (BENADRYL) 25 MG tablet Take 50 mg by mouth every 6 (six)  hours as needed for itching.      . hydrochlorothiazide (HYDRODIURIL) 25 MG tablet Take 25 mg by mouth daily.        Review of Systems:   Review of Systems  Constitutional: Negative for fever, chills, weight loss, malaise/fatigue and diaphoresis.  HENT: Negative for hearing loss, ear pain, nosebleeds, congestion, sore throat, neck pain, tinnitus and ear discharge.   Eyes: Negative for blurred vision, double vision, photophobia, pain, discharge and redness.  Respiratory: Negative for cough, hemoptysis, sputum production, shortness of breath, wheezing and stridor.   Cardiovascular: Negative for chest pain, palpitations, orthopnea,  leg swelling  Gastrointestinal: Negative for abdominal pain heartburn, nausea, vomiting, diarrhea, constipation, blood in stool Genitourinary: Negative for dysuria, urgency,  hematuria and flank pain.  Musculoskeletal: Negative for myalgias, back pain, joint pain and falls.  Skin: Negative for itching and rash.  Neurological: Negative for dizziness, tingling, tremors, sensory change, speech change, focal weakness, seizures, loss of consciousness, weakness. Positive for frontal headache.  Endo/Heme/Allergies: Negative for environmental allergies and polydipsia. Does not bruise/bleed easily.  Psychiatric/Behavioral: Negative for depression, suicidal ideas, hallucinations, memory loss and substance abuse. The patient is not nervous/anxious and does not have insomnia.      Physical Exam  Blood pressure 122/78, pulse 91, temperature 98.3 F (36.8 C), temperature source Oral, resp. rate 20, height 5\' 7"  (1.702 m), weight 104.69 kg (230 lb 12.8 oz), last menstrual  period 01/03/2013, SpO2 100.00%. GENERAL: Well-developed, well-nourished female in no acute distress.  LUNGS: Clear to auscultation bilaterally.  HEART: Regular rate and rhythm. ABDOMEN: Soft, Mildly tender suprapubic area, nondistended, gravid.  EXTREMITIES: Nontender, no edema, 2+ distal pulses. DTR's  2+ CERVICAL EXAM: Closed/Thick/High  FHT:  Baseline rate 150 bpm ; No contractions noted.  Labs: Results for orders placed during the hospital encounter of 07/13/13 (from the past 24 hour(s))  URINALYSIS, ROUTINE W REFLEX MICROSCOPIC   Collection Time    07/13/13  6:30 PM      Result Value Range   Color, Urine YELLOW  YELLOW   APPearance HAZY (*) CLEAR   Specific Gravity, Urine >1.030 (*) 1.005 - 1.030   pH 6.0  5.0 - 8.0   Glucose, UA NEGATIVE  NEGATIVE mg/dL   Hgb urine dipstick SMALL (*) NEGATIVE   Bilirubin Urine NEGATIVE  NEGATIVE   Ketones, ur 15 (*) NEGATIVE mg/dL   Protein, ur NEGATIVE  NEGATIVE mg/dL   Urobilinogen, UA 0.2  0.0 - 1.0 mg/dL   Nitrite NEGATIVE  NEGATIVE   Leukocytes, UA SMALL (*) NEGATIVE  URINE MICROSCOPIC-ADD ON   Collection Time    07/13/13  6:30 PM      Result Value Range   Squamous Epithelial / LPF FEW (*) RARE   WBC, UA 3-6  <3 WBC/hpf   RBC / HPF 0-2  <3 RBC/hpf   Urine-Other MICROSCOPIC EXAM PERFORMED ON UNCONCENTRATED URINE    CBC   Collection Time    07/13/13  9:35 PM      Result Value Range   WBC 9.2  4.0 - 10.5 K/uL   RBC 3.05 (*) 3.87 - 5.11 MIL/uL   Hemoglobin 9.5 (*) 12.0 - 15.0 g/dL   HCT 16.1 (*) 09.6 - 04.5 %   MCV 92.8  78.0 - 100.0 fL   MCH 31.1  26.0 - 34.0 pg   MCHC 33.6  30.0 - 36.0 g/dL   RDW 40.9  81.1 - 91.4 %   Platelets 267  150 - 400 K/uL  DIFFERENTIAL   Collection Time    07/13/13  9:35 PM      Result Value Range   Neutrophils Relative % 71  43 - 77 %   Neutro Abs 6.6  1.7 - 7.7 K/uL   Lymphocytes Relative 21  12 - 46 %   Lymphs Abs 1.9  0.7 - 4.0 K/uL   Monocytes Relative 6  3 - 12 %   Monocytes Absolute 0.6  0.1 - 1.0 K/uL   Eosinophils Relative 2  0 - 5 %   Eosinophils Absolute 0.2  0.0 - 0.7 K/uL   Basophils Relative 0  0 - 1 %   Basophils Absolute 0.0  0.0 - 0.1 K/uL   Imaging Studies:  No results found.  Assessment: YEMAYA BARNIER is  41 y.o. [phone] at [redacted]w[redacted]d presents with headache and lower  abdominal pain of 2 days.   Plan: [facility] made aware of patient for prenatal care Outpatient U/S ordered OB panel ordered and pending BV: Clue cells present on Wet prep, Flagyl ordered.  Urine negative Headache improved with Fioricet x1 Patient discharged home with encouraged to follow up immediately to establish [facility]  Kuneff, Renee 10/2/201410:16 PM  I have seen and examined this patient and agree the above assessment. CRESENZO-DISHMAN,Demaryius Imran 07/14/2013 12:19 AM

## 2013-07-14 LAB — RPR: RPR Ser Ql: NONREACTIVE

## 2013-07-14 LAB — GC/CHLAMYDIA PROBE AMP
CT Probe RNA: NEGATIVE
GC Probe RNA: POSITIVE — AB

## 2013-07-17 ENCOUNTER — Ambulatory Visit (INDEPENDENT_AMBULATORY_CARE_PROVIDER_SITE_OTHER): Payer: Medicaid Other | Admitting: General Practice

## 2013-07-17 VITALS — BP 122/79 | HR 91 | Temp 97.4°F | Ht 66.0 in | Wt 230.7 lb

## 2013-07-17 DIAGNOSIS — O98219 Gonorrhea complicating pregnancy, unspecified trimester: Secondary | ICD-10-CM

## 2013-07-17 DIAGNOSIS — O98213 Gonorrhea complicating pregnancy, third trimester: Secondary | ICD-10-CM

## 2013-07-17 MED ORDER — AZITHROMYCIN 1 G PO PACK
1.0000 g | PACK | Freq: Once | ORAL | Status: AC
  Administered 2013-07-17: 1 g via ORAL

## 2013-07-17 MED ORDER — CEFTRIAXONE SODIUM 250 MG IJ SOLR
250.0000 mg | Freq: Once | INTRAMUSCULAR | Status: AC
  Administered 2013-07-17: 250 mg via INTRAMUSCULAR

## 2013-07-18 NOTE — MAU Provider Note (Signed)
Attestation of Attending Supervision of Advanced Practitioner (CNM/NP): Evaluation and management procedures were performed by the Advanced Practitioner under my supervision and collaboration.  I have reviewed the Advanced Practitioner's note and chart, and I agree with the management and plan.  Sherion Dooly 07/18/2013 8:34 AM

## 2013-07-21 ENCOUNTER — Telehealth: Payer: Self-pay | Admitting: *Deleted

## 2013-07-21 NOTE — Telephone Encounter (Signed)
Tanya Fry called and states she has an appointment to start prenatal care 08/02/13 and had went to MAU and was told she needed an ultrasound- but did not get an appointment- wants to know how to get it scheduled. Called and scheduled [place] and  Called patient with appointment. Patient voices understanding.

## 2013-07-24 ENCOUNTER — Ambulatory Visit (HOSPITAL_COMMUNITY): Payer: Medicaid Other

## 2013-07-25 ENCOUNTER — Other Ambulatory Visit (HOSPITAL_COMMUNITY): Payer: Self-pay | Admitting: Advanced Practice Midwife

## 2013-07-25 ENCOUNTER — Ambulatory Visit (HOSPITAL_COMMUNITY)
Admission: RE | Admit: 2013-07-25 | Discharge: 2013-07-25 | Disposition: A | Discharge status: Home / Self Care | Payer: Medicaid Other | Source: Ambulatory Visit | Attending: Advanced Practice Midwife | Admitting: Advanced Practice Midwife

## 2013-07-25 DIAGNOSIS — O0930 Supervision of pregnancy with insufficient antenatal care, unspecified trimester: Secondary | ICD-10-CM | POA: Insufficient documentation

## 2013-07-25 DIAGNOSIS — Z3483 Encounter for supervision of other normal pregnancy, third trimester: Secondary | ICD-10-CM

## 2013-07-25 DIAGNOSIS — O3430 Maternal care for cervical incompetence, unspecified trimester: Secondary | ICD-10-CM | POA: Insufficient documentation

## 2013-07-25 DIAGNOSIS — O10019 Pre-existing essential hypertension complicating pregnancy, unspecified trimester: Secondary | ICD-10-CM | POA: Insufficient documentation

## 2013-07-25 DIAGNOSIS — Z8751 Personal history of pre-term labor: Secondary | ICD-10-CM | POA: Insufficient documentation

## 2013-08-02 ENCOUNTER — Ambulatory Visit (INDEPENDENT_AMBULATORY_CARE_PROVIDER_SITE_OTHER): Payer: Medicaid Other | Admitting: Family Medicine

## 2013-08-02 ENCOUNTER — Encounter: Payer: Self-pay | Admitting: Family Medicine

## 2013-08-02 ENCOUNTER — Other Ambulatory Visit (HOSPITAL_COMMUNITY)
Admission: RE | Admit: 2013-08-02 | Discharge: 2013-08-02 | Disposition: A | Discharge status: Home / Self Care | Payer: Medicaid Other | Source: Ambulatory Visit | Attending: Family Medicine | Admitting: Family Medicine

## 2013-08-02 VITALS — BP 116/77 | Temp 97.9°F | Wt 230.0 lb

## 2013-08-02 DIAGNOSIS — I10 Essential (primary) hypertension: Secondary | ICD-10-CM | POA: Insufficient documentation

## 2013-08-02 DIAGNOSIS — Z01419 Encounter for gynecological examination (general) (routine) without abnormal findings: Secondary | ICD-10-CM | POA: Insufficient documentation

## 2013-08-02 DIAGNOSIS — O09293 Supervision of pregnancy with other poor reproductive or obstetric history, third trimester: Secondary | ICD-10-CM

## 2013-08-02 DIAGNOSIS — Z1151 Encounter for screening for human papillomavirus (HPV): Secondary | ICD-10-CM | POA: Insufficient documentation

## 2013-08-02 DIAGNOSIS — O0973 Supervision of high risk pregnancy due to social problems, third trimester: Secondary | ICD-10-CM | POA: Insufficient documentation

## 2013-08-02 DIAGNOSIS — O09529 Supervision of elderly multigravida, unspecified trimester: Secondary | ICD-10-CM

## 2013-08-02 DIAGNOSIS — Z23 Encounter for immunization: Secondary | ICD-10-CM

## 2013-08-02 DIAGNOSIS — O09299 Supervision of pregnancy with other poor reproductive or obstetric history, unspecified trimester: Secondary | ICD-10-CM | POA: Insufficient documentation

## 2013-08-02 DIAGNOSIS — O09213 Supervision of pregnancy with history of pre-term labor, third trimester: Secondary | ICD-10-CM

## 2013-08-02 DIAGNOSIS — O98213 Gonorrhea complicating pregnancy, third trimester: Secondary | ICD-10-CM

## 2013-08-02 DIAGNOSIS — O10013 Pre-existing essential hypertension complicating pregnancy, third trimester: Secondary | ICD-10-CM

## 2013-08-02 DIAGNOSIS — O98219 Gonorrhea complicating pregnancy, unspecified trimester: Secondary | ICD-10-CM

## 2013-08-02 DIAGNOSIS — O09899 Supervision of other high risk pregnancies, unspecified trimester: Secondary | ICD-10-CM

## 2013-08-02 DIAGNOSIS — O10019 Pre-existing essential hypertension complicating pregnancy, unspecified trimester: Secondary | ICD-10-CM

## 2013-08-02 DIAGNOSIS — O09219 Supervision of pregnancy with history of pre-term labor, unspecified trimester: Secondary | ICD-10-CM

## 2013-08-02 DIAGNOSIS — O09523 Supervision of elderly multigravida, third trimester: Secondary | ICD-10-CM

## 2013-08-02 DIAGNOSIS — Z113 Encounter for screening for infections with a predominantly sexual mode of transmission: Secondary | ICD-10-CM | POA: Insufficient documentation

## 2013-08-02 LAB — POCT URINALYSIS DIP (DEVICE)
Glucose, UA: NEGATIVE mg/dL
Nitrite: NEGATIVE
Urobilinogen, UA: 0.2 mg/dL (ref 0.0–1.0)

## 2013-08-02 MED ORDER — TETANUS-DIPHTH-ACELL PERTUSSIS 5-2.5-18.5 LF-MCG/0.5 IM SUSP: 0.5000 mL | Freq: Once | INTRAMUSCULAR | Status: DC

## 2013-08-02 NOTE — Progress Notes (Signed)
Pulse: 91

## 2013-08-02 NOTE — Patient Instructions (Signed)

## 2013-08-02 NOTE — Progress Notes (Signed)
Subjective:    Tanya Fry is a [id number] [redacted]w[redacted]d being seen today for her first obstetrical visit.  Her obstetrical history is significant for advanced maternal age, obesity and hx of CHTN, hx of incompetent cervix, hx of postpartum cardiomyopathy. Patient does intend to breast feed. Pregnancy history fully reviewed. Good fetal movement  Patient reports no bleeding, no contractions, no cramping and no leaking.  Pt seen in MAU tx for BV, GC/C. TOC today with pap.  Filed Vitals:   08/02/13 1026  BP: 116/77  Temp: 97.9 F (36.6 C)  Weight: 104.327 kg (230 lb)    HISTORY: OB History  Gravida Para Term Preterm AB SAB TAB Ectopic Multiple Living  8 3 2 1 4 4    3     # Outcome Date GA Lbr Len/2nd Weight Sex Delivery Anes PTL Lv  8 CUR           7 TRM 2007 [redacted]w[redacted]d   F SVD  N Y     Comments: cerclage with pg   6 PRE 1992    F SVD  Y Y     Comments: 28wks NICU for 6 wks  5 SAB           4 SAB           3 SAB           2 SAB           1 TRM  [redacted]w[redacted]d   F SVD  N Y     Comments: cerclage with this pg      Past Medical History  Diagnosis Date  . Hypertension   . Anemia   . Postpartum cardiomyopathy    Past Surgical History  Procedure Laterality Date  . No past surgeries     Family History  Problem Relation Age of Onset  . Cancer Maternal Grandmother     breast cancer  . Cancer Paternal Grandfather     prostate     Exam    Uterus:     Pelvic Exam:    Perineum: Hemorrhoids   Vulva: normal   Vagina:  normal mucosa   pH:    Cervix: no bleeding following Pap, no cervical motion tenderness and no lesions   Adnexa: normal adnexa and no mass, fullness, tenderness   Bony Pelvis: proven to 6.8  System: Breast:  normal appearance, no masses or tenderness   Skin: normal coloration and turgor, no rashes    Neurologic: oriented, normal, negative, normal mood   Extremities: normal strength, tone, and muscle mass   HEENT extra ocular movement intact and sclera clear,  anicteric   Mouth/Teeth mucous membranes moist, pharynx normal without lesions   Neck supple and no masses   Cardiovascular: regular rate and rhythm, no murmurs or gallops, no murmur at baseline. Monitor with hx of cardiomyopathy   Respiratory:  appears well, vitals normal, no respiratory distress, acyanotic, normal RR, ear and throat exam is normal, neck free of mass or lymphadenopathy, chest clear, no wheezing, crepitations, rhonchi, normal symmetric air entry   Abdomen: soft, non-tender; bowel sounds normal; no masses,  no organomegaly   Urinary: urethral meatus normal      Assessment:  Tanya Fry is a 42 y.o. [id number] at [redacted]w[redacted]d by L=28 here for ROB visit.  Discussed with Patient:  -Plans to breast feed.  All questions answered. -Continue prenatal vitamins. -Reviewed fetal kick counts (Pt to perform daily at a time when the baby  is active, lie laterally with both hands on belly in quiet room and count all movements (hiccups, shoulder rolls, obvious kicks, etc); pt is to report to clinic or MAU for less than 10 movements felt in a one hour time period-pt told as soon as she counts 10 movements the count is complete.)  - Routine precautions discussed (depression, infection s/s).   Patient provided with all pertinent phone numbers for emergencies. - RTC for any VB, regular, painful cramps/ctxs occurring at a rate of >2/10 min, fever (100.5 or higher), n/v/d, any pain that is unresolving or worsening, LOF, decreased fetal movement, CP, SOB, edema  Problems: There are no active problems to display for this patient.  HX of impontent cervix: start 17P injections, ordered today Hx of PP cardiomyopathy. Monitor postpatum for 48hrs, referral to cardiology for echo and evl. Hx HTN: off meds now, BP normal. Will hold meds and monitor, if elevated will collect baseline labs. Pt f/u in 1 week for reeval. Pt seen in MAU tx for BV, GC/C. TOC today with pap. BTL consent today.  Discussed with Dr.  Jolayne Panther. Agreed with plan  To Do: 1. Glucose tolerance test ordered.  Patient will draw in clinic.  Will f/u test and amend plan based on results. 2. CBC and antibody screen ordered. 3. PN Labs today  [ ]  Vaccines: Flu: 10.22  Tdap: 10/22 [ ]  BCM:  BTL - will consent  Edu: [ x ] PTL precautions; [ ]  BF class; [ ]  childbirth class; [ ]   BF counseling;   Dionna Wiedemann RYAN 08/02/2013  Repeat the UA as clean catch at next visit. Asymptomtic today.

## 2013-08-03 LAB — OBSTETRIC PANEL
Eosinophils Absolute: 0.2 10*3/uL (ref 0.0–0.7)
Hepatitis B Surface Ag: NEGATIVE
Lymphocytes Relative: 18 % (ref 12–46)
Lymphs Abs: 1.2 10*3/uL (ref 0.7–4.0)
MCH: 30.6 pg (ref 26.0–34.0)
Neutrophils Relative %: 72 % (ref 43–77)
Platelets: 258 10*3/uL (ref 150–400)
RBC: 3.07 MIL/uL — ABNORMAL LOW (ref 3.87–5.11)
WBC: 6.7 10*3/uL (ref 4.0–10.5)

## 2013-08-03 LAB — CULTURE, OB URINE
Colony Count: NO GROWTH
Organism ID, Bacteria: NO GROWTH

## 2013-08-04 LAB — HEMOGLOBINOPATHY EVALUATION
Hgb A: 97.1 % (ref 96.8–97.8)
Hgb F Quant: 0 % (ref 0.0–2.0)

## 2013-08-07 ENCOUNTER — Encounter: Payer: Self-pay | Admitting: Family Medicine

## 2013-08-08 LAB — BENZODIAZEPINES (GC/LC/MS), URINE
Alprazolam (GC/LC/MS), ur confirm: NEGATIVE ng/mL
Estazolam (GC/LC/MS), ur confirm: NEGATIVE ng/mL
Midazolam (GC/LC/MS), ur confirm: NEGATIVE ng/mL
Oxazepam (GC/LC/MS), ur confirm: 94 ng/mL — AB
Temazepam (GC/LC/MS), ur confirm: NEGATIVE ng/mL

## 2013-08-08 LAB — PRESCRIPTION MONITORING PROFILE (19 PANEL)
Buprenorphine, Urine: NEGATIVE ng/mL
Carisoprodol, Urine: NEGATIVE ng/mL
Fentanyl, Ur: NEGATIVE ng/mL
MDMA URINE: NEGATIVE ng/mL
Meperidine, Ur: NEGATIVE ng/mL
Methadone Screen, Urine: NEGATIVE ng/mL
Methaqualone: NEGATIVE ng/mL
Nitrites, Initial: NEGATIVE ug/mL
Opiate Screen, Urine: NEGATIVE ng/mL
Phencyclidine, Ur: NEGATIVE ng/mL
Propoxyphene: NEGATIVE ng/mL

## 2013-08-09 ENCOUNTER — Encounter: Payer: Self-pay | Admitting: Family Medicine

## 2013-08-09 DIAGNOSIS — F1390 Sedative, hypnotic, or anxiolytic use, unspecified, uncomplicated: Secondary | ICD-10-CM | POA: Insufficient documentation

## 2013-08-09 DIAGNOSIS — F1310 Sedative, hypnotic or anxiolytic abuse, uncomplicated: Secondary | ICD-10-CM

## 2013-08-10 ENCOUNTER — Encounter: Payer: Self-pay | Admitting: Family Medicine

## 2013-08-10 ENCOUNTER — Ambulatory Visit (INDEPENDENT_AMBULATORY_CARE_PROVIDER_SITE_OTHER): Payer: Medicaid Other | Admitting: Family Medicine

## 2013-08-10 VITALS — BP 121/73 | Wt 231.2 lb

## 2013-08-10 DIAGNOSIS — O10013 Pre-existing essential hypertension complicating pregnancy, third trimester: Secondary | ICD-10-CM

## 2013-08-10 DIAGNOSIS — F1390 Sedative, hypnotic, or anxiolytic use, unspecified, uncomplicated: Secondary | ICD-10-CM

## 2013-08-10 DIAGNOSIS — O903 Peripartum cardiomyopathy: Secondary | ICD-10-CM

## 2013-08-10 DIAGNOSIS — F1310 Sedative, hypnotic or anxiolytic abuse, uncomplicated: Secondary | ICD-10-CM

## 2013-08-10 DIAGNOSIS — O10019 Pre-existing essential hypertension complicating pregnancy, unspecified trimester: Secondary | ICD-10-CM

## 2013-08-10 DIAGNOSIS — O09299 Supervision of pregnancy with other poor reproductive or obstetric history, unspecified trimester: Secondary | ICD-10-CM

## 2013-08-10 DIAGNOSIS — O09293 Supervision of pregnancy with other poor reproductive or obstetric history, third trimester: Secondary | ICD-10-CM

## 2013-08-10 LAB — POCT URINALYSIS DIP (DEVICE)
Leukocytes, UA: NEGATIVE
Specific Gravity, Urine: >=1.03 (ref 1.005–1.030)
pH: 6 (ref 5.0–8.0)

## 2013-08-10 NOTE — Progress Notes (Signed)
2+ proteinuria, needs baseline labs and 24 hour urine Schedule u/s for growth ECHO for h/o pp cardiomyopathy

## 2013-08-10 NOTE — Progress Notes (Signed)
Appointment scheduled 08/17/13 at 4 pm with Dr. Allyson Sabal at [facility] at [facility] U/S scheduled 08/24/13 at 815 am.

## 2013-08-10 NOTE — Patient Instructions (Signed)
Pregnancy - Third Trimester The third trimester of pregnancy (the last 3 months) is a period of the most rapid growth for you and your baby. The baby approaches a length of 20 inches and a weight of 6 to 10 pounds. The baby is adding on fat and getting ready for life outside your body. While inside, babies have periods of sleeping and waking, sucking thumbs, and hiccuping. You can often feel small contractions of the uterus. This is false labor. It is also called Braxton-Hicks contractions. This is like a practice for labor. The usual problems in this stage of pregnancy include more difficulty breathing, swelling of the hands and feet from water retention, and having to urinate more often because of the uterus and baby pressing on your bladder.  PRENATAL EXAMS  Blood work may continue to be done during prenatal exams. These tests are done to check on your health and the probable health of your baby. Blood work is used to follow your blood levels (hemoglobin). Anemia (low hemoglobin) is common during pregnancy. Iron and vitamins are given to help prevent this. You may also continue to be checked for diabetes. Some of the past blood tests may be done again.  The size of the uterus is measured during each visit. This makes sure your baby is growing properly according to your pregnancy dates.  Your blood pressure is checked every prenatal visit. This is to make sure you are not getting toxemia.  Your urine is checked every prenatal visit for infection, diabetes, and protein.  Your weight is checked at each visit. This is done to make sure gains are happening at the suggested rate and that you and your baby are growing normally.  Sometimes, an ultrasound is performed to confirm the position and the proper growth and development of the baby. This is a test done that bounces harmless sound waves off the baby so your caregiver can more accurately determine a due date.  Discuss the type of pain medicine and  anesthesia you will have during your labor and delivery.  Discuss the possibility and anesthesia if a cesarean section might be necessary.  Inform your caregiver if there is any mental or physical violence at home. Sometimes, a specialized non-stress test, contraction stress test, and biophysical profile are done to make sure the baby is not having a problem. Checking the amniotic fluid surrounding the baby is called an amniocentesis. The amniotic fluid is removed by sticking a needle into the belly (abdomen). This is sometimes done near the end of pregnancy if an early delivery is required. In this case, it is done to help make sure the baby's lungs are mature enough for the baby to live outside of the womb. If the lungs are not mature and it is unsafe to deliver the baby, an injection of cortisone medicine is given to the mother 1 to 2 days before the delivery. This helps the baby's lungs mature and makes it safer to deliver the baby. CHANGES OCCURING IN THE THIRD TRIMESTER OF PREGNANCY Your body goes through many changes during pregnancy. They vary from person to person. Talk to your caregiver about changes you notice and are concerned about.  During the last trimester, you have probably had an increase in your appetite. It is normal to have cravings for certain foods. This varies from person to person and pregnancy to pregnancy.  You may begin to get stretch marks on your hips, abdomen, and breasts. These are normal changes in the body   during pregnancy. There are no exercises or medicines to take which prevent this change.  Constipation may be treated with a stool softener or adding bulk to your diet. Drinking lots of fluids, fiber in vegetables, fruits, and whole grains are helpful.  Exercising is also helpful. If you have been very active up until your pregnancy, most of these activities can be continued during your pregnancy. If you have been less active, it is helpful to start an exercise  program such as walking. Consult your caregiver before starting exercise programs.  Avoid all smoking, alcohol, non-prescribed drugs, herbs and "street drugs" during your pregnancy. These chemicals affect the formation and growth of the baby. Avoid chemicals throughout the pregnancy to ensure the delivery of a healthy infant.  Backache, varicose veins, and hemorrhoids may develop or get worse.  You will tire more easily in the third trimester, which is normal.  The baby's movements may be stronger and more often.  You may become short of breath easily.  Your belly button may stick out.  A yellow discharge may leak from your breasts called colostrum.  You may have a bloody mucus discharge. This usually occurs a few days to a week before labor begins. HOME CARE INSTRUCTIONS   Keep your caregiver's appointments. Follow your caregiver's instructions regarding medicine use, exercise, and diet.  During pregnancy, you are providing food for you and your baby. Continue to eat regular, well-balanced meals. Choose foods such as meat, fish, milk and other low fat dairy products, vegetables, fruits, and whole-grain breads and cereals. Your caregiver will tell you of the ideal weight gain.  A physical sexual relationship may be continued throughout pregnancy if there are no other problems such as early (premature) leaking of amniotic fluid from the membranes, vaginal bleeding, or belly (abdominal) pain.  Exercise regularly if there are no restrictions. Check with your caregiver if you are unsure of the safety of your exercises. Greater weight gain will occur in the last 2 trimesters of pregnancy. Exercising helps:  Control your weight.  Get you in shape for labor and delivery.  You lose weight after you deliver.  Rest a lot with legs elevated, or as needed for leg cramps or low back pain.  Wear a good support or jogging bra for breast tenderness during pregnancy. This may help if worn during  sleep. Pads or tissues may be used in the bra if you are leaking colostrum.  Do not use hot tubs, steam rooms, or saunas.  Wear your seat belt when driving. This protects you and your baby if you are in an accident.  Avoid raw meat, cat litter boxes and soil used by cats. These carry germs that can cause birth defects in the baby.  It is easier to leak urine during pregnancy. Tightening up and strengthening the pelvic muscles will help with this problem. You can practice stopping your urination while you are going to the bathroom. These are the same muscles you need to strengthen. It is also the muscles you would use if you were trying to stop from passing gas. You can practice tightening these muscles up 10 times a set and repeating this about 3 times per day. Once you know what muscles to tighten up, do not perform these exercises during urination. It is more likely to cause an infection by backing up the urine.  Ask for help if you have financial, counseling, or nutritional needs during pregnancy. Your caregiver will be able to offer counseling for these   needs as well as refer you for other special needs.  Make a list of emergency phone numbers and have them available.  Plan on getting help from family or friends when you go home from the hospital.  Make a trial run to the hospital.  Take prenatal classes with the father to understand, practice, and ask questions about the labor and delivery.  Prepare the baby's room or nursery.  Do not travel out of the city unless it is absolutely necessary and with the advice of your caregiver.  Wear only low or no heal shoes to have better balance and prevent falling. MEDICINES AND DRUG USE IN PREGNANCY  Take prenatal vitamins as directed. The vitamin should contain 1 milligram of folic acid. Keep all vitamins out of reach of children. Only a couple vitamins or tablets containing iron may be fatal to a baby or young child when ingested.  Avoid use  of all medicines, including herbs, over-the-counter medicines, not prescribed or suggested by your caregiver. Only take over-the-counter or prescription medicines for pain, discomfort, or fever as directed by your caregiver. Do not use aspirin, ibuprofen or naproxen unless approved by your caregiver.  Let your caregiver also know about herbs you may be using.  Alcohol is related to a number of birth defects. This includes fetal alcohol syndrome. All alcohol, in any form, should be avoided completely. Smoking will cause low birth rate and premature babies.  Illegal drugs are very harmful to the baby. They are absolutely forbidden. A baby born to an addicted mother will be addicted at birth. The baby will go through the same withdrawal an adult does. SEEK MEDICAL CARE IF: You have any concerns or worries during your pregnancy. It is better to call with your questions if you feel they cannot wait, rather than worry about them. SEEK IMMEDIATE MEDICAL CARE IF:   An unexplained oral temperature above 102 F (38.9 C) develops, or as your caregiver suggests.  You have leaking of fluid from the vagina. If leaking membranes are suspected, take your temperature and tell your caregiver of this when you call.  There is vaginal spotting, bleeding or passing clots. Tell your caregiver of the amount and how many pads are used.  You develop a bad smelling vaginal discharge with a change in the color from clear to white.  You develop vomiting that lasts more than 24 hours.  You develop chills or fever.  You develop shortness of breath.  You develop burning on urination.  You loose more than 2 pounds of weight or gain more than 2 pounds of weight or as suggested by your caregiver.  You notice sudden swelling of your face, hands, and feet or legs.  You develop belly (abdominal) pain. Round ligament discomfort is a common non-cancerous (benign) cause of abdominal pain in pregnancy. Your caregiver still  must evaluate you.  You develop a severe headache that does not go away.  You develop visual problems, blurred or double vision.  If you have not felt your baby move for more than 1 hour. If you think the baby is not moving as much as usual, eat something with sugar in it and lie down on your left side for an hour. The baby should move at least 4 to 5 times per hour. Call right away if your baby moves less than that.  You fall, are in a car accident, or any kind of trauma.  There is mental or physical violence at home. Document Released: 09/22/2001   Document Revised: 06/22/2012 Document Reviewed: 03/27/2009 [employer] Patient Information 2014 [employer].  Breastfeeding A change in hormones during your pregnancy causes growth of your breast tissue and an increase in number and size of milk ducts. The hormone prolactin allows proteins, sugars, and fats from your blood supply to make breast milk in your milk-producing glands. The hormone progesterone prevents breast milk from being released before the birth of your baby. After the birth of your baby, your progesterone level decreases allowing breast milk to be released. Thoughts of your baby, as well as his or her sucking or crying, can stimulate the release of milk from the milk-producing glands. Deciding to breastfeed (nurse) is one of the best choices you can make for you and your baby. The information that follows gives a brief review of the benefits, as well as other important skills to know about breastfeeding. BENEFITS OF BREASTFEEDING For your baby  The first milk (colostrum) helps your baby's digestive system function better.   There are antibodies in your milk that help your baby fight off infections.   Your baby has a lower incidence of asthma, allergies, and sudden infant death syndrome (SIDS).   The nutrients in breast milk are better for your baby than infant formulas.  Breast milk improves your baby's brain development.    Your baby will have less gas, colic, and constipation.  Your baby is less likely to develop other conditions, such as childhood obesity, asthma, or diabetes mellitus. For you  Breastfeeding helps develop a very special bond between you and your baby.   Breastfeeding is convenient, always available at the correct temperature, and costs nothing.   Breastfeeding helps to burn calories and helps you lose the weight gained during pregnancy.   Breastfeeding makes your uterus contract back down to normal size faster and slows bleeding following delivery.   Breastfeeding mothers have a lower risk of developing osteoporosis or breast or ovarian cancer later in life.  BREASTFEEDING FREQUENCY  A healthy, full-term baby may breastfeed as often as every hour or space his or her feedings to every 3 hours. Breastfeeding frequency will vary from baby to baby.   Newborns should be fed no less than every 2 3 hours during the day and every 4 5 hours during the night. You should breastfeed a minimum of 8 feedings in a 24 hour period.  Awaken your baby to breastfeed if it has been 3 4 hours since the last feeding.  Breastfeed when you feel the need to reduce the fullness of your breasts or when your newborn shows signs of hunger. Signs that your baby may be hungry include:  Increased alertness or activity.  Stretching.  Movement of the head from side to side.  Movement of the head and opening of the mouth when the corner of the mouth or cheek is stroked (rooting).  Increased sucking sounds, smacking lips, cooing, sighing, or squeaking.  Hand-to-mouth movements.  Increased sucking of fingers or hands.  Fussing.  Intermittent crying.  Signs of extreme hunger will require calming and consoling before you try to feed your baby. Signs of extreme hunger may include:  Restlessness.  A loud, strong cry.  Screaming.  Frequent feeding will help you make more milk and will help prevent  problems, such as sore nipples and engorgement of the breasts.  BREASTFEEDING   Whether lying down or sitting, be sure that the baby's abdomen is facing your abdomen.   Support your breast with 4 fingers under your breast   and your thumb above your nipple. Make sure your fingers are well away from your nipple and your baby's mouth.   Stroke your baby's lips gently with your finger or nipple.   When your baby's mouth is open wide enough, place all of your nipple and as much of the colored area around your nipple (areola) as possible into your baby's mouth.  More areola should be visible above his or her upper lip than below his or her lower lip.  Your baby's tongue should be between his or her lower gum and your breast.  Ensure that your baby's mouth is correctly positioned around the nipple (latched). Your baby's lips should create a seal on your breast.  Signs that your baby has effectively latched onto your nipple include:  Tugging or sucking without pain.  Swallowing heard between sucks.  Absent click or smacking sound.  Muscle movement above and in front of his or her ears with sucking.  Your baby must suck about 2 3 minutes in order to get your milk. Allow your baby to feed on each breast as long as he or she wants. Nurse your baby until he or she unlatches or falls asleep at the first breast, then offer the second breast.  Signs that your baby is full and satisfied include:  A gradual decrease in the number of sucks or complete cessation of sucking.  Falling asleep.  Extension or relaxation of his or her body.  Retention of a small amount of milk in his or her mouth.  Letting go of your breast by himself or herself.  Signs of effective breastfeeding in you include:  Breasts that have increased firmness, weight, and size prior to feeding.  Breasts that are softer after nursing.  Increased milk volume, as well as a change in milk consistency and color by the 5th  day of breastfeeding.  Breast fullness relieved by breastfeeding.  Nipples are not sore, cracked, or bleeding.  If needed, break the suction by putting your finger into the corner of your baby's mouth and sliding your finger between his or her gums. Then, remove your breast from his or her mouth.  It is common for babies to spit up a small amount after a feeding.  Babies often swallow air during feeding. This can make babies fussy. Burping your baby between breasts can help with this.  Vitamin D supplements are recommended for babies who get only breast milk.  Avoid using a pacifier during your baby's first 4 6 weeks.  Avoid supplemental feedings of water, formula, or juice in place of breastfeeding. Breast milk is all the food your baby needs. It is not necessary for your baby to have water or formula. Your breasts will make more milk if supplemental feedings are avoided during the early weeks. HOW TO TELL WHETHER YOUR BABY IS GETTING ENOUGH BREAST MILK Wondering whether or not your baby is getting enough milk is a common concern among mothers. You can be assured that your baby is getting enough milk if:   Your baby is actively sucking and you hear swallowing.   Your baby seems relaxed and satisfied after a feeding.   Your baby nurses at least 8 12 times in a 24 hour time period.  During the first 3 5 days of age:  Your baby is wetting at least 3 5 diapers in a 24 hour period. The urine should be clear and pale yellow.  Your baby is having at least 3 4 stools in   a 24 hour period. The stool should be soft and yellow.  At 5 7 days of age, your baby is having at least 3 6 stools in a 24 hour period. The stool should be seedy and yellow by 5 days of age.  Your baby has a weight loss less than 7 10% during the first 3 days of age.  Your baby does not lose weight after 3 7 days of age.  Your baby gains 4 7 ounces each week after he or she is 4 days of age.  Your baby gains weight  by 5 days of age and is back to birth weight within 2 weeks. ENGORGEMENT In the first week after your baby is born, you may experience extremely full breasts (engorgement). When engorged, your breasts may feel heavy, warm, or tender to the touch. Engorgement peaks within 24 48 hours after delivery of your baby.  Engorgement may be reduced by:  Continuing to breastfeed.  Increasing the frequency of breastfeeding.  Taking warm showers or applying warm, moist heat to your breasts just before each feeding. This increases circulation and helps the milk flow.   Gently massaging your breast before and during the feedings. With your fingertips, massage from your chest wall towards your nipple in a circular motion.   Ensuring that your baby empties at least one breast at every feeding. It also helps to start the next feeding on the opposite breast.   Expressing breast milk by hand or by using a breast pump to empty the breasts if your baby is sleepy, or not nursing well. You may also want to express milk if you are returning to work oryou feel you are getting engorged.  Ensuring your baby is latched on and positioned properly while breastfeeding. If you follow these suggestions, your engorgement should improve in 24 48 hours. If you are still experiencing difficulty, call your lactation consultant or caregiver.  CARING FOR YOURSELF Take care of your breasts.  Bathe or shower daily.   Avoid using soap on your nipples.   Wear a supportive bra. Avoid wearing underwire style bras.  Air dry your nipples for a 3 4minutes after each feeding.   Use only cotton bra pads to absorb breast milk leakage. Leaking of breast milk between feedings is normal.   Use only pure lanolin on your nipples after nursing. You do not need to wash it off before feeding your baby again. Another option is to express a few drops of breast milk and gently massage that milk into your nipples.  Continue breast  self-awareness checks. Take care of yourself.  Eat healthy foods. Alternate 3 meals with 3 snacks.  Avoid foods that you notice affect your baby in a bad way.  Drink milk, fruit juice, and water to satisfy your thirst (about 8 glasses a day).   Rest often, relax, and take your prenatal vitamins to prevent fatigue, stress, and anemia.  Avoid chewing and smoking tobacco.  Avoid alcohol and drug use.  Take over-the-counter and prescribed medicine only as directed by your caregiver or pharmacist. You should always check with your caregiver or pharmacist before taking any new medicine, vitamin, or herbal supplement.  Know that pregnancy is possible while breastfeeding. If desired, talk to your caregiver about family planning and safe birth control methods that may be used while breastfeeding. SEEK MEDICAL CARE IF:   You feel like you want to stop breastfeeding or have become frustrated with breastfeeding.  You have painful breasts or nipples.    Your nipples are cracked or bleeding.  Your breasts are red, tender, or warm.  You have a swollen area on either breast.  You have a fever or chills.  You have nausea or vomiting.  You have drainage from your nipples.  Your breasts do not become full before feedings by the 5th day after delivery.  You feel sad and depressed.  Your baby is too sleepy to eat well.  Your baby is having trouble sleeping.   Your baby is wetting less than 3 diapers in a 24 hour period.  Your baby has less than 3 stools in a 24 hour period.  Your baby's skin or the white part of his or her eyes becomes more yellow.   Your baby is not gaining weight by 5 days of age. MAKE SURE YOU:   Understand these instructions.  Will watch your condition.  Will get help right away if you are not doing well or get worse. Document Released: 09/28/2005 Document Revised: 06/22/2012 Document Reviewed: 05/04/2012 [employer] Patient Information 2014 [employer].  

## 2013-08-10 NOTE — Progress Notes (Signed)
Pulse- 87 

## 2013-08-17 ENCOUNTER — Ambulatory Visit (INDEPENDENT_AMBULATORY_CARE_PROVIDER_SITE_OTHER): Payer: Medicaid Other | Admitting: Cardiovascular Disease

## 2013-08-17 ENCOUNTER — Ambulatory Visit (INDEPENDENT_AMBULATORY_CARE_PROVIDER_SITE_OTHER): Payer: Medicaid Other | Admitting: Obstetrics & Gynecology

## 2013-08-17 ENCOUNTER — Encounter: Payer: Self-pay | Admitting: Cardiovascular Disease

## 2013-08-17 VITALS — BP 110/70 | HR 96 | Ht 67.0 in | Wt 232.5 lb

## 2013-08-17 VITALS — BP 114/69 | Wt 229.8 lb

## 2013-08-17 DIAGNOSIS — O903 Peripartum cardiomyopathy: Secondary | ICD-10-CM

## 2013-08-17 DIAGNOSIS — O10013 Pre-existing essential hypertension complicating pregnancy, third trimester: Secondary | ICD-10-CM

## 2013-08-17 DIAGNOSIS — O09219 Supervision of pregnancy with history of pre-term labor, unspecified trimester: Secondary | ICD-10-CM

## 2013-08-17 DIAGNOSIS — O09213 Supervision of pregnancy with history of pre-term labor, third trimester: Secondary | ICD-10-CM

## 2013-08-17 DIAGNOSIS — O10019 Pre-existing essential hypertension complicating pregnancy, unspecified trimester: Secondary | ICD-10-CM

## 2013-08-17 LAB — COMPREHENSIVE METABOLIC PANEL
ALT: <8 U/L (ref 0–35)
AST: 9 U/L (ref 0–37)
Albumin: 3.1 g/dL — ABNORMAL LOW (ref 3.5–5.2)
Alkaline Phosphatase: 53 U/L (ref 39–117)
BUN: 7 mg/dL (ref 6–23)
CO2: 24 mEq/L (ref 19–32)
Calcium: 8.4 mg/dL (ref 8.4–10.5)
Chloride: 108 mEq/L (ref 96–112)
Creat: 0.54 mg/dL (ref 0.50–1.10)
Glucose, Bld: 81 mg/dL (ref 70–99)
Potassium: 4.1 mEq/L (ref 3.5–5.3)
Total Protein: 5.8 g/dL — ABNORMAL LOW (ref 6.0–8.3)

## 2013-08-17 LAB — POCT URINALYSIS DIP (DEVICE)
Glucose, UA: NEGATIVE mg/dL
Ketones, ur: NEGATIVE mg/dL
Nitrite: NEGATIVE
Urobilinogen, UA: 0.2 mg/dL (ref 0.0–1.0)

## 2013-08-17 NOTE — Patient Instructions (Signed)
To see Dr Allyson Sabal as needed.   Scheduled  Echocardiogram in the near future.

## 2013-08-17 NOTE — Assessment & Plan Note (Signed)
Currently normotensive on no medications.

## 2013-08-17 NOTE — Progress Notes (Signed)
Sees cardiologist today. NST reactive.

## 2013-08-17 NOTE — Progress Notes (Signed)
P = 90    Pt brought 24 urine today but was very low volume- will recollect today and bring back to clinic in am.   [place] for growth scheduled on 11/13

## 2013-08-17 NOTE — Progress Notes (Signed)
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 She was referred by [facility] hospital for cardiovascular evaluation because of a remote history of peripartum or myopathy. She is [redacted] weeks pregnant with her first child. He craniopathy occurred 12 years ago after her second pregnancy. She did fine after her third pregnancy is currently asymptomatic in the third trimester of her fourth pregnancy. She did see Dr. Carolanne Grumbling at that time and had depressed LV function but suddenly improved by 2-D echo. She was treated with medications at that time. She is no other cardiovascular risk factors.   Current Outpatient Prescriptions  Medication Sig Dispense Refill  . acetaminophen (TYLENOL) 325 MG tablet Take 650 mg by mouth every 6 (six) hours as needed for pain. For pain      . Prenatal Vit-Fe Fumarate-FA (PRENATAL VITAMINS) 28-0.8 MG TABS Take 1 tablet by mouth.       No current facility-administered medications for this visit.    Allergies  Allergen Reactions  . Shellfish Allergy Anaphylaxis    History   Social History  . Marital Status: Single    Spouse Name: N/A    Number of Children: N/A  . Years of Education: N/A   Occupational History  . Not on file.   Social History Main Topics  . Smoking status: Former Smoker -- 0.50 packs/day  . Smokeless tobacco: Never Used  . Alcohol Use: Yes  . Drug Use: No  . Sexual Activity: Not Currently   Other Topics Concern  . Not on file   Social History Narrative  . No narrative on file     Review of Systems: General: negative for chills, fever, night sweats or weight changes.  Cardiovascular: negative for chest pain, dyspnea on exertion, edema,  orthopnea, palpitations, paroxysmal nocturnal dyspnea or shortness of breath Dermatological: negative for rash Respiratory: negative for cough or wheezing Urologic: negative for hematuria Abdominal: negative for nausea, vomiting, diarrhea, bright red blood per rectum, melena, or hematemesis Neurologic: negative for visual changes, syncope, or dizziness All other systems reviewed and are otherwise negative except as noted above.    Blood pressure 110/70, pulse 96, height 5\' 7"  (1.702 m), weight 232 lb 8 oz (105.461 kg), last menstrual period 01/03/2013.  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 She saw Dr. Armanda Magic at that time. Apparently her LV function was depressed and she was treated medically at. Subsequent echoes have shown her LV function to have normalized. She is currently [redacted] weeks pregnant with her third child and is completely asymptomatic. I am going to repeat a 2-D echo for LV function and we'll see her back when necessary  Benign essential HTN, chronic, antepartum Currently normotensive on no medications      Runell Gess MD [facility], [facility]  08/17/2013 4:02 PM

## 2013-08-17 NOTE — Patient Instructions (Signed)
Third Trimester of Pregnancy  The third trimester is from week 29 through week 42, months 7 through 9. The third trimester is a time when the fetus is growing rapidly. At the end of the ninth month, the fetus is about 20 inches in length and weighs 6 10 pounds.   BODY CHANGES  Your body goes through many changes during pregnancy. The changes vary from woman to woman.    Your weight will continue to increase. You can expect to gain 25 35 pounds (11 16 kg) by the end of the pregnancy.   You may begin to get stretch marks on your hips, abdomen, and breasts.   You may urinate more often because the fetus is moving lower into your pelvis and pressing on your bladder.   You may develop or continue to have heartburn as a result of your pregnancy.   You may develop constipation because certain hormones are causing the muscles that push waste through your intestines to slow down.   You may develop hemorrhoids or swollen, bulging veins (varicose veins).   You may have pelvic pain because of the weight gain and pregnancy hormones relaxing your joints between the bones in your pelvis. Back aches may result from over exertion of the muscles supporting your posture.   Your breasts will continue to grow and be tender. A yellow discharge may leak from your breasts called colostrum.   Your belly button may stick out.   You may feel short of breath because of your expanding uterus.   You may notice the fetus "dropping," or moving lower in your abdomen.   You may have a bloody mucus discharge. This usually occurs a few days to a week before labor begins.   Your cervix becomes thin and soft (effaced) near your due date.  WHAT TO EXPECT AT YOUR PRENATAL EXAMS   You will have prenatal exams every 2 weeks until week 36. Then, you will have weekly prenatal exams. During a routine prenatal visit:   You will be weighed to make sure you and the fetus are growing normally.   Your blood pressure is taken.   Your abdomen will be  measured to track your baby's growth.   The fetal heartbeat will be listened to.   Any test results from the previous visit will be discussed.   You may have a cervical check near your due date to see if you have effaced.  At around 36 weeks, your caregiver will check your cervix. At the same time, your caregiver will also perform a test on the secretions of the vaginal tissue. This test is to determine if a type of bacteria, Group B streptococcus, is present. Your caregiver will explain this further.  Your caregiver may ask you:   What your birth plan is.   How you are feeling.   If you are feeling the baby move.   If you have had any abnormal symptoms, such as leaking fluid, bleeding, severe headaches, or abdominal cramping.   If you have any questions.  Other tests or screenings that may be performed during your third trimester include:   Blood tests that check for low iron levels (anemia).   Fetal testing to check the health, activity level, and growth of the fetus. Testing is done if you have certain medical conditions or if there are problems during the pregnancy.  FALSE LABOR  You may feel small, irregular contractions that eventually go away. These are called Braxton Hicks contractions, or   false labor. Contractions may last for hours, days, or even weeks before true labor sets in. If contractions come at regular intervals, intensify, or become painful, it is best to be seen by your caregiver.   SIGNS OF LABOR    Menstrual-like cramps.   Contractions that are 5 minutes apart or less.   Contractions that start on the top of the uterus and spread down to the lower abdomen and back.   A sense of increased pelvic pressure or back pain.   A watery or bloody mucus discharge that comes from the vagina.  If you have any of these signs before the 37th week of pregnancy, call your caregiver right away. You need to go to the hospital to get checked immediately.  HOME CARE INSTRUCTIONS    Avoid all  smoking, herbs, alcohol, and unprescribed drugs. These chemicals affect the formation and growth of the baby.   Follow your caregiver's instructions regarding medicine use. There are medicines that are either safe or unsafe to take during pregnancy.   Exercise only as directed by your caregiver. Experiencing uterine cramps is a good sign to stop exercising.   Continue to eat regular, healthy meals.   Wear a good support bra for breast tenderness.   Do not use hot tubs, steam rooms, or saunas.   Wear your seat belt at all times when driving.   Avoid raw meat, uncooked cheese, cat litter boxes, and soil used by cats. These carry germs that can cause birth defects in the baby.   Take your prenatal vitamins.   Try taking a stool softener (if your caregiver approves) if you develop constipation. Eat more high-fiber foods, such as fresh vegetables or fruit and whole grains. Drink plenty of fluids to keep your urine clear or pale yellow.   Take warm sitz baths to soothe any pain or discomfort caused by hemorrhoids. Use hemorrhoid cream if your caregiver approves.   If you develop varicose veins, wear support hose. Elevate your feet for 15 minutes, 3 4 times a day. Limit salt in your diet.   Avoid heavy lifting, wear low heal shoes, and practice good posture.   Rest a lot with your legs elevated if you have leg cramps or low back pain.   Visit your dentist if you have not gone during your pregnancy. Use a soft toothbrush to brush your teeth and be gentle when you floss.   A sexual relationship may be continued unless your caregiver directs you otherwise.   Do not travel far distances unless it is absolutely necessary and only with the approval of your caregiver.   Take prenatal classes to understand, practice, and ask questions about the labor and delivery.   Make a trial run to the hospital.   Pack your hospital bag.   Prepare the baby's nursery.   Continue to go to all your prenatal visits as directed  by your caregiver.  SEEK MEDICAL CARE IF:   You are unsure if you are in labor or if your water has broken.   You have dizziness.   You have mild pelvic cramps, pelvic pressure, or nagging pain in your abdominal area.   You have persistent nausea, vomiting, or diarrhea.   You have a bad smelling vaginal discharge.   You have pain with urination.  SEEK IMMEDIATE MEDICAL CARE IF:    You have a fever.   You are leaking fluid from your vagina.   You have spotting or bleeding from your vagina.     You have severe abdominal cramping or pain.   You have rapid weight loss or gain.   You have shortness of breath with chest pain.   You notice sudden or extreme swelling of your face, hands, ankles, feet, or legs.   You have not felt your baby move in over an hour.   You have severe headaches that do not go away with medicine.   You have vision changes.  Document Released: 09/22/2001 Document Revised: 05/31/2013 Document Reviewed: 11/29/2012  Exit[employer] Patient Information 2014 [employer].

## 2013-08-17 NOTE — Assessment & Plan Note (Signed)
Patient had a history of peripartum hemopathy after her second pregnancy 12 years ago. She saw Dr. Armanda Magic at that time. Apparently her LV function was depressed and she was treated medically at. Subsequent echoes have shown her LV function to have normalized. She is currently [redacted] weeks pregnant with her third child and is completely asymptomatic. I am going to repeat a 2-D echo for LV function and we'll see her back when necessary

## 2013-08-18 ENCOUNTER — Encounter: Payer: Self-pay | Admitting: Cardiovascular Disease

## 2013-08-19 ENCOUNTER — Encounter: Payer: Self-pay | Admitting: Family Medicine

## 2013-08-19 LAB — CREATININE CLEARANCE, URINE, 24 HOUR
Creatinine Clearance: 268 mL/min — ABNORMAL HIGH (ref 75–115)
Creatinine: 0.54 mg/dL (ref 0.50–1.10)

## 2013-08-19 LAB — PROTEIN, URINE, 24 HOUR: Protein, 24H Urine: 305 mg/d — ABNORMAL HIGH (ref 50–100)

## 2013-08-21 ENCOUNTER — Ambulatory Visit (INDEPENDENT_AMBULATORY_CARE_PROVIDER_SITE_OTHER): Payer: Medicaid Other | Admitting: *Deleted

## 2013-08-21 VITALS — BP 111/63

## 2013-08-21 DIAGNOSIS — O10019 Pre-existing essential hypertension complicating pregnancy, unspecified trimester: Secondary | ICD-10-CM

## 2013-08-21 DIAGNOSIS — O10013 Pre-existing essential hypertension complicating pregnancy, third trimester: Secondary | ICD-10-CM

## 2013-08-21 NOTE — Progress Notes (Signed)
P-95 

## 2013-08-24 ENCOUNTER — Ambulatory Visit (INDEPENDENT_AMBULATORY_CARE_PROVIDER_SITE_OTHER): Payer: Medicaid Other | Admitting: Obstetrics and Gynecology

## 2013-08-24 ENCOUNTER — Encounter: Payer: Self-pay | Admitting: Obstetrics and Gynecology

## 2013-08-24 ENCOUNTER — Ambulatory Visit (HOSPITAL_COMMUNITY): Admission: RE | Admit: 2013-08-24 | Payer: Medicaid Other | Source: Ambulatory Visit

## 2013-08-24 ENCOUNTER — Ambulatory Visit (HOSPITAL_COMMUNITY)
Admission: RE | Admit: 2013-08-24 | Discharge: 2013-08-24 | Disposition: A | Discharge status: Home / Self Care | Payer: Medicaid Other | Source: Ambulatory Visit | Attending: Family Medicine | Admitting: Family Medicine

## 2013-08-24 VITALS — BP 108/69 | Wt 235.2 lb

## 2013-08-24 DIAGNOSIS — F1310 Sedative, hypnotic or anxiolytic abuse, uncomplicated: Secondary | ICD-10-CM

## 2013-08-24 DIAGNOSIS — O10019 Pre-existing essential hypertension complicating pregnancy, unspecified trimester: Secondary | ICD-10-CM

## 2013-08-24 DIAGNOSIS — F1390 Sedative, hypnotic, or anxiolytic use, unspecified, uncomplicated: Secondary | ICD-10-CM

## 2013-08-24 DIAGNOSIS — O98219 Gonorrhea complicating pregnancy, unspecified trimester: Secondary | ICD-10-CM

## 2013-08-24 DIAGNOSIS — O10013 Pre-existing essential hypertension complicating pregnancy, third trimester: Secondary | ICD-10-CM

## 2013-08-24 DIAGNOSIS — O09219 Supervision of pregnancy with history of pre-term labor, unspecified trimester: Secondary | ICD-10-CM

## 2013-08-24 DIAGNOSIS — O365990 Maternal care for other known or suspected poor fetal growth, unspecified trimester, not applicable or unspecified: Secondary | ICD-10-CM | POA: Insufficient documentation

## 2013-08-24 DIAGNOSIS — Z8751 Personal history of pre-term labor: Secondary | ICD-10-CM | POA: Insufficient documentation

## 2013-08-24 DIAGNOSIS — O09293 Supervision of pregnancy with other poor reproductive or obstetric history, third trimester: Secondary | ICD-10-CM

## 2013-08-24 DIAGNOSIS — E669 Obesity, unspecified: Secondary | ICD-10-CM | POA: Insufficient documentation

## 2013-08-24 DIAGNOSIS — O98213 Gonorrhea complicating pregnancy, third trimester: Secondary | ICD-10-CM

## 2013-08-24 DIAGNOSIS — O09213 Supervision of pregnancy with history of pre-term labor, third trimester: Secondary | ICD-10-CM

## 2013-08-24 DIAGNOSIS — O10913 Unspecified pre-existing hypertension complicating pregnancy, third trimester: Secondary | ICD-10-CM

## 2013-08-24 DIAGNOSIS — O09529 Supervision of elderly multigravida, unspecified trimester: Secondary | ICD-10-CM

## 2013-08-24 DIAGNOSIS — O903 Peripartum cardiomyopathy: Secondary | ICD-10-CM

## 2013-08-24 DIAGNOSIS — O09299 Supervision of pregnancy with other poor reproductive or obstetric history, unspecified trimester: Secondary | ICD-10-CM

## 2013-08-24 DIAGNOSIS — O09523 Supervision of elderly multigravida, third trimester: Secondary | ICD-10-CM

## 2013-08-24 LAB — POCT URINALYSIS DIP (DEVICE)
Bilirubin Urine: NEGATIVE
Glucose, UA: NEGATIVE mg/dL
Ketones, ur: NEGATIVE mg/dL
Leukocytes, UA: NEGATIVE
Protein, ur: 100 mg/dL — AB
Specific Gravity, Urine: >=1.03 (ref 1.005–1.030)
Urobilinogen, UA: 0.2 mg/dL (ref 0.0–1.0)

## 2013-08-24 NOTE — Progress Notes (Signed)
P=88 

## 2013-08-24 NOTE — Progress Notes (Signed)
NSt reviewed and reactive. Patient doing well without complaints. FM/PTL precautions reviewed. Patient admits to taking a muscle relaxant from her uncle once earlier in pregnancy- patient does not know medication but she has not taking it since.

## 2013-08-28 ENCOUNTER — Ambulatory Visit (INDEPENDENT_AMBULATORY_CARE_PROVIDER_SITE_OTHER): Payer: Medicaid Other | Admitting: *Deleted

## 2013-08-28 VITALS — BP 115/69

## 2013-08-28 DIAGNOSIS — O10019 Pre-existing essential hypertension complicating pregnancy, unspecified trimester: Secondary | ICD-10-CM

## 2013-08-28 DIAGNOSIS — O10013 Pre-existing essential hypertension complicating pregnancy, third trimester: Secondary | ICD-10-CM

## 2013-08-28 NOTE — Progress Notes (Signed)
NST reviewed and reactive.  

## 2013-08-28 NOTE — Progress Notes (Signed)
P-84  

## 2013-08-31 ENCOUNTER — Ambulatory Visit (INDEPENDENT_AMBULATORY_CARE_PROVIDER_SITE_OTHER): Payer: Medicaid Other | Admitting: Obstetrics and Gynecology

## 2013-08-31 ENCOUNTER — Encounter: Payer: Self-pay | Admitting: Obstetrics and Gynecology

## 2013-08-31 VITALS — BP 119/77 | Wt 236.0 lb

## 2013-08-31 DIAGNOSIS — O10013 Pre-existing essential hypertension complicating pregnancy, third trimester: Secondary | ICD-10-CM

## 2013-08-31 DIAGNOSIS — F1390 Sedative, hypnotic, or anxiolytic use, unspecified, uncomplicated: Secondary | ICD-10-CM

## 2013-08-31 DIAGNOSIS — O09293 Supervision of pregnancy with other poor reproductive or obstetric history, third trimester: Secondary | ICD-10-CM

## 2013-08-31 DIAGNOSIS — O10019 Pre-existing essential hypertension complicating pregnancy, unspecified trimester: Secondary | ICD-10-CM

## 2013-08-31 DIAGNOSIS — F1310 Sedative, hypnotic or anxiolytic abuse, uncomplicated: Secondary | ICD-10-CM

## 2013-08-31 DIAGNOSIS — O09529 Supervision of elderly multigravida, unspecified trimester: Secondary | ICD-10-CM

## 2013-08-31 DIAGNOSIS — O903 Peripartum cardiomyopathy: Secondary | ICD-10-CM

## 2013-08-31 DIAGNOSIS — O09213 Supervision of pregnancy with history of pre-term labor, third trimester: Secondary | ICD-10-CM

## 2013-08-31 DIAGNOSIS — O09299 Supervision of pregnancy with other poor reproductive or obstetric history, unspecified trimester: Secondary | ICD-10-CM

## 2013-08-31 DIAGNOSIS — O09219 Supervision of pregnancy with history of pre-term labor, unspecified trimester: Secondary | ICD-10-CM

## 2013-08-31 DIAGNOSIS — O09523 Supervision of elderly multigravida, third trimester: Secondary | ICD-10-CM

## 2013-08-31 DIAGNOSIS — O368390 Maternal care for abnormalities of the fetal heart rate or rhythm, unspecified trimester, not applicable or unspecified: Secondary | ICD-10-CM

## 2013-08-31 LAB — POCT URINALYSIS DIP (DEVICE)
Bilirubin Urine: NEGATIVE
Glucose, UA: NEGATIVE mg/dL
Ketones, ur: NEGATIVE mg/dL
Leukocytes, UA: NEGATIVE
Nitrite: NEGATIVE
Specific Gravity, Urine: >=1.03 (ref 1.005–1.030)
Urobilinogen, UA: 0.2 mg/dL (ref 0.0–1.0)
pH: 6 (ref 5.0–8.0)

## 2013-08-31 NOTE — Progress Notes (Signed)
P = 98 

## 2013-08-31 NOTE — Progress Notes (Signed)
NST reviewed and category II- BPP 8/8. Patient doing well without complaints. FM/PTL precautions reviewed. Patient scheduled for echo on 11/25

## 2013-09-04 ENCOUNTER — Ambulatory Visit (INDEPENDENT_AMBULATORY_CARE_PROVIDER_SITE_OTHER): Payer: Medicaid Other | Admitting: *Deleted

## 2013-09-04 VITALS — BP 117/63 | Wt 237.8 lb

## 2013-09-04 DIAGNOSIS — O10019 Pre-existing essential hypertension complicating pregnancy, unspecified trimester: Secondary | ICD-10-CM

## 2013-09-04 DIAGNOSIS — O10013 Pre-existing essential hypertension complicating pregnancy, third trimester: Secondary | ICD-10-CM

## 2013-09-04 NOTE — Progress Notes (Signed)
P=95  NST

## 2013-09-05 ENCOUNTER — Ambulatory Visit (HOSPITAL_COMMUNITY)
Admission: RE | Admit: 2013-09-05 | Discharge: 2013-09-05 | Disposition: A | Discharge status: Home / Self Care | Payer: Medicaid Other | Source: Ambulatory Visit | Attending: Cardiovascular Disease | Admitting: Cardiovascular Disease

## 2013-09-05 ENCOUNTER — Encounter: Payer: Self-pay | Admitting: *Deleted

## 2013-09-05 DIAGNOSIS — O903 Peripartum cardiomyopathy: Secondary | ICD-10-CM

## 2013-09-05 DIAGNOSIS — I2510 Atherosclerotic heart disease of native coronary artery without angina pectoris: Secondary | ICD-10-CM | POA: Insufficient documentation

## 2013-09-05 DIAGNOSIS — O10019 Pre-existing essential hypertension complicating pregnancy, unspecified trimester: Secondary | ICD-10-CM | POA: Insufficient documentation

## 2013-09-05 DIAGNOSIS — I428 Other cardiomyopathies: Secondary | ICD-10-CM

## 2013-09-05 NOTE — Progress Notes (Signed)
2D Echo Performed 09/05/2013    Alexio Sroka, RCS  

## 2013-09-06 ENCOUNTER — Ambulatory Visit (HOSPITAL_COMMUNITY)
Admission: RE | Admit: 2013-09-06 | Discharge: 2013-09-06 | Disposition: A | Discharge status: Home / Self Care | Payer: Medicaid Other | Source: Ambulatory Visit | Attending: Family Medicine | Admitting: Family Medicine

## 2013-09-06 ENCOUNTER — Ambulatory Visit (HOSPITAL_COMMUNITY): Admission: RE | Admit: 2013-09-06 | Payer: Medicaid Other | Source: Ambulatory Visit

## 2013-09-06 ENCOUNTER — Ambulatory Visit (INDEPENDENT_AMBULATORY_CARE_PROVIDER_SITE_OTHER): Payer: Medicaid Other | Admitting: General Practice

## 2013-09-06 VITALS — BP 126/74 | Wt 238.9 lb

## 2013-09-06 DIAGNOSIS — O10019 Pre-existing essential hypertension complicating pregnancy, unspecified trimester: Secondary | ICD-10-CM | POA: Insufficient documentation

## 2013-09-06 DIAGNOSIS — O10013 Pre-existing essential hypertension complicating pregnancy, third trimester: Secondary | ICD-10-CM

## 2013-09-06 LAB — POCT URINALYSIS DIP (DEVICE)
Glucose, UA: NEGATIVE mg/dL
Leukocytes, UA: NEGATIVE
Nitrite: NEGATIVE
Urobilinogen, UA: 0.2 mg/dL (ref 0.0–1.0)

## 2013-09-06 NOTE — Progress Notes (Signed)
Pulse: 90

## 2013-09-06 NOTE — Progress Notes (Signed)
11/24 NST reviewed and reactive 

## 2013-09-06 NOTE — Progress Notes (Signed)
NST reviewed and reactive.  Tanya Fry, M.D., FACOG    

## 2013-09-11 ENCOUNTER — Ambulatory Visit (INDEPENDENT_AMBULATORY_CARE_PROVIDER_SITE_OTHER): Payer: Medicaid Other | Admitting: Obstetrics & Gynecology

## 2013-09-11 VITALS — BP 112/73 | Temp 97.0°F | Wt 239.8 lb

## 2013-09-11 DIAGNOSIS — O10019 Pre-existing essential hypertension complicating pregnancy, unspecified trimester: Secondary | ICD-10-CM

## 2013-09-11 DIAGNOSIS — O09219 Supervision of pregnancy with history of pre-term labor, unspecified trimester: Secondary | ICD-10-CM

## 2013-09-11 DIAGNOSIS — O10013 Pre-existing essential hypertension complicating pregnancy, third trimester: Secondary | ICD-10-CM

## 2013-09-11 DIAGNOSIS — O09213 Supervision of pregnancy with history of pre-term labor, third trimester: Secondary | ICD-10-CM

## 2013-09-11 LAB — OB RESULTS CONSOLE GC/CHLAMYDIA
Chlamydia: NEGATIVE
Gonorrhea: NEGATIVE

## 2013-09-11 LAB — FETAL NONSTRESS TEST

## 2013-09-11 LAB — POCT URINALYSIS DIP (DEVICE)
Bilirubin Urine: NEGATIVE
Glucose, UA: NEGATIVE mg/dL
Nitrite: NEGATIVE
Protein, ur: 100 mg/dL — AB
Urobilinogen, UA: 0.2 mg/dL (ref 0.0–1.0)

## 2013-09-11 LAB — OB RESULTS CONSOLE GBS: GBS: POSITIVE

## 2013-09-11 NOTE — Progress Notes (Signed)
NST reactive. Rare contractions. Recent bleeding hemorrhoid now better. Echocardiogram was normal  GC CT sent

## 2013-09-11 NOTE — Progress Notes (Signed)
Pt reports having constipation and hemorrhoids w/ bleeding.   Pt encouraged to begin using Colace twice daily.

## 2013-09-11 NOTE — Progress Notes (Signed)
P= 95 C/o of intermittent lower abdominal/pelvic pressure.

## 2013-09-11 NOTE — Patient Instructions (Signed)
Third Trimester of Pregnancy  The third trimester is from week 29 through week 42, months 7 through 9. The third trimester is a time when the fetus is growing rapidly. At the end of the ninth month, the fetus is about 20 inches in length and weighs 6 10 pounds.   BODY CHANGES  Your body goes through many changes during pregnancy. The changes vary from woman to woman.    Your weight will continue to increase. You can expect to gain 25 35 pounds (11 16 kg) by the end of the pregnancy.   You may begin to get stretch marks on your hips, abdomen, and breasts.   You may urinate more often because the fetus is moving lower into your pelvis and pressing on your bladder.   You may develop or continue to have heartburn as a result of your pregnancy.   You may develop constipation because certain hormones are causing the muscles that push waste through your intestines to slow down.   You may develop hemorrhoids or swollen, bulging veins (varicose veins).   You may have pelvic pain because of the weight gain and pregnancy hormones relaxing your joints between the bones in your pelvis. Back aches may result from over exertion of the muscles supporting your posture.   Your breasts will continue to grow and be tender. A yellow discharge may leak from your breasts called colostrum.   Your belly button may stick out.   You may feel short of breath because of your expanding uterus.   You may notice the fetus "dropping," or moving lower in your abdomen.   You may have a bloody mucus discharge. This usually occurs a few days to a week before labor begins.   Your cervix becomes thin and soft (effaced) near your due date.  WHAT TO EXPECT AT YOUR PRENATAL EXAMS   You will have prenatal exams every 2 weeks until week 36. Then, you will have weekly prenatal exams. During a routine prenatal visit:   You will be weighed to make sure you and the fetus are growing normally.   Your blood pressure is taken.   Your abdomen will be  measured to track your baby's growth.   The fetal heartbeat will be listened to.   Any test results from the previous visit will be discussed.   You may have a cervical check near your due date to see if you have effaced.  At around 36 weeks, your caregiver will check your cervix. At the same time, your caregiver will also perform a test on the secretions of the vaginal tissue. This test is to determine if a type of bacteria, Group B streptococcus, is present. Your caregiver will explain this further.  Your caregiver may ask you:   What your birth plan is.   How you are feeling.   If you are feeling the baby move.   If you have had any abnormal symptoms, such as leaking fluid, bleeding, severe headaches, or abdominal cramping.   If you have any questions.  Other tests or screenings that may be performed during your third trimester include:   Blood tests that check for low iron levels (anemia).   Fetal testing to check the health, activity level, and growth of the fetus. Testing is done if you have certain medical conditions or if there are problems during the pregnancy.  FALSE LABOR  You may feel small, irregular contractions that eventually go away. These are called Braxton Hicks contractions, or   false labor. Contractions may last for hours, days, or even weeks before true labor sets in. If contractions come at regular intervals, intensify, or become painful, it is best to be seen by your caregiver.   SIGNS OF LABOR    Menstrual-like cramps.   Contractions that are 5 minutes apart or less.   Contractions that start on the top of the uterus and spread down to the lower abdomen and back.   A sense of increased pelvic pressure or back pain.   A watery or bloody mucus discharge that comes from the vagina.  If you have any of these signs before the 37th week of pregnancy, call your caregiver right away. You need to go to the hospital to get checked immediately.  HOME CARE INSTRUCTIONS    Avoid all  smoking, herbs, alcohol, and unprescribed drugs. These chemicals affect the formation and growth of the baby.   Follow your caregiver's instructions regarding medicine use. There are medicines that are either safe or unsafe to take during pregnancy.   Exercise only as directed by your caregiver. Experiencing uterine cramps is a good sign to stop exercising.   Continue to eat regular, healthy meals.   Wear a good support bra for breast tenderness.   Do not use hot tubs, steam rooms, or saunas.   Wear your seat belt at all times when driving.   Avoid raw meat, uncooked cheese, cat litter boxes, and soil used by cats. These carry germs that can cause birth defects in the baby.   Take your prenatal vitamins.   Try taking a stool softener (if your caregiver approves) if you develop constipation. Eat more high-fiber foods, such as fresh vegetables or fruit and whole grains. Drink plenty of fluids to keep your urine clear or pale yellow.   Take warm sitz baths to soothe any pain or discomfort caused by hemorrhoids. Use hemorrhoid cream if your caregiver approves.   If you develop varicose veins, wear support hose. Elevate your feet for 15 minutes, 3 4 times a day. Limit salt in your diet.   Avoid heavy lifting, wear low heal shoes, and practice good posture.   Rest a lot with your legs elevated if you have leg cramps or low back pain.   Visit your dentist if you have not gone during your pregnancy. Use a soft toothbrush to brush your teeth and be gentle when you floss.   A sexual relationship may be continued unless your caregiver directs you otherwise.   Do not travel far distances unless it is absolutely necessary and only with the approval of your caregiver.   Take prenatal classes to understand, practice, and ask questions about the labor and delivery.   Make a trial run to the hospital.   Pack your hospital bag.   Prepare the baby's nursery.   Continue to go to all your prenatal visits as directed  by your caregiver.  SEEK MEDICAL CARE IF:   You are unsure if you are in labor or if your water has broken.   You have dizziness.   You have mild pelvic cramps, pelvic pressure, or nagging pain in your abdominal area.   You have persistent nausea, vomiting, or diarrhea.   You have a bad smelling vaginal discharge.   You have pain with urination.  SEEK IMMEDIATE MEDICAL CARE IF:    You have a fever.   You are leaking fluid from your vagina.   You have spotting or bleeding from your vagina.     You have severe abdominal cramping or pain.   You have rapid weight loss or gain.   You have shortness of breath with chest pain.   You notice sudden or extreme swelling of your face, hands, ankles, feet, or legs.   You have not felt your baby move in over an hour.   You have severe headaches that do not go away with medicine.   You have vision changes.  Document Released: 09/22/2001 Document Revised: 05/31/2013 Document Reviewed: 11/29/2012  Exit[employer] Patient Information 2014 [employer].

## 2013-09-12 LAB — CULTURE, BETA STREP (GROUP B ONLY)

## 2013-09-12 LAB — GC/CHLAMYDIA PROBE AMP: GC Probe RNA: NEGATIVE

## 2013-09-14 ENCOUNTER — Ambulatory Visit (INDEPENDENT_AMBULATORY_CARE_PROVIDER_SITE_OTHER): Payer: Medicaid Other | Admitting: Obstetrics & Gynecology

## 2013-09-14 VITALS — BP 124/65 | Temp 98.8°F | Wt 236.2 lb

## 2013-09-14 DIAGNOSIS — O10019 Pre-existing essential hypertension complicating pregnancy, unspecified trimester: Secondary | ICD-10-CM

## 2013-09-14 DIAGNOSIS — O10013 Pre-existing essential hypertension complicating pregnancy, third trimester: Secondary | ICD-10-CM

## 2013-09-14 NOTE — Progress Notes (Signed)
P=78, Patient did not need to seen for ob fu today, as was seen 09/11/13.

## 2013-09-14 NOTE — Progress Notes (Signed)
NST reactive.  No visit today.

## 2013-09-18 ENCOUNTER — Ambulatory Visit (INDEPENDENT_AMBULATORY_CARE_PROVIDER_SITE_OTHER): Payer: Medicaid Other | Admitting: *Deleted

## 2013-09-18 VITALS — BP 122/78 | Wt 233.5 lb

## 2013-09-18 DIAGNOSIS — O10019 Pre-existing essential hypertension complicating pregnancy, unspecified trimester: Secondary | ICD-10-CM

## 2013-09-18 DIAGNOSIS — O10013 Pre-existing essential hypertension complicating pregnancy, third trimester: Secondary | ICD-10-CM

## 2013-09-18 NOTE — Progress Notes (Signed)
NST reactive today.

## 2013-09-18 NOTE — Progress Notes (Signed)
p=108 

## 2013-09-19 ENCOUNTER — Encounter: Payer: Self-pay | Admitting: *Deleted

## 2013-09-21 ENCOUNTER — Encounter: Payer: Self-pay | Admitting: Obstetrics and Gynecology

## 2013-09-21 ENCOUNTER — Ambulatory Visit (INDEPENDENT_AMBULATORY_CARE_PROVIDER_SITE_OTHER): Payer: Medicaid Other | Admitting: Obstetrics and Gynecology

## 2013-09-21 VITALS — BP 119/75 | Wt 235.3 lb

## 2013-09-21 DIAGNOSIS — O09523 Supervision of elderly multigravida, third trimester: Secondary | ICD-10-CM

## 2013-09-21 DIAGNOSIS — O10013 Pre-existing essential hypertension complicating pregnancy, third trimester: Secondary | ICD-10-CM

## 2013-09-21 DIAGNOSIS — O09213 Supervision of pregnancy with history of pre-term labor, third trimester: Secondary | ICD-10-CM

## 2013-09-21 DIAGNOSIS — O09529 Supervision of elderly multigravida, unspecified trimester: Secondary | ICD-10-CM

## 2013-09-21 DIAGNOSIS — O09299 Supervision of pregnancy with other poor reproductive or obstetric history, unspecified trimester: Secondary | ICD-10-CM

## 2013-09-21 DIAGNOSIS — O10019 Pre-existing essential hypertension complicating pregnancy, unspecified trimester: Secondary | ICD-10-CM

## 2013-09-21 DIAGNOSIS — O09219 Supervision of pregnancy with history of pre-term labor, unspecified trimester: Secondary | ICD-10-CM

## 2013-09-21 DIAGNOSIS — O09293 Supervision of pregnancy with other poor reproductive or obstetric history, third trimester: Secondary | ICD-10-CM

## 2013-09-21 DIAGNOSIS — O903 Peripartum cardiomyopathy: Secondary | ICD-10-CM

## 2013-09-21 LAB — POCT URINALYSIS DIP (DEVICE)
Ketones, ur: NEGATIVE mg/dL
Nitrite: NEGATIVE
Protein, ur: 100 mg/dL — AB
Specific Gravity, Urine: >=1.03 (ref 1.005–1.030)
Urobilinogen, UA: 0.2 mg/dL (ref 0.0–1.0)
pH: 6 (ref 5.0–8.0)

## 2013-09-21 NOTE — Progress Notes (Signed)
P=84,  

## 2013-09-21 NOTE — Progress Notes (Signed)
Patient doing well without complaints. FM/labor precautions reviewed. F/U growth ultrasound scheduled. NST reviewed and reactive

## 2013-09-22 ENCOUNTER — Encounter: Payer: Self-pay | Admitting: *Deleted

## 2013-09-24 ENCOUNTER — Inpatient Hospital Stay (HOSPITAL_COMMUNITY)
Admission: AD | Admit: 2013-09-24 | Discharge: 2013-09-25 | Disposition: A | Discharge status: Home / Self Care | Payer: Medicaid Other | Source: Ambulatory Visit | Attending: Obstetrics and Gynecology | Admitting: Obstetrics and Gynecology

## 2013-09-24 ENCOUNTER — Encounter (HOSPITAL_COMMUNITY): Payer: Self-pay | Admitting: *Deleted

## 2013-09-24 DIAGNOSIS — O903 Peripartum cardiomyopathy: Secondary | ICD-10-CM

## 2013-09-24 DIAGNOSIS — R002 Palpitations: Secondary | ICD-10-CM | POA: Insufficient documentation

## 2013-09-24 DIAGNOSIS — T781XXA Other adverse food reactions, not elsewhere classified, initial encounter: Secondary | ICD-10-CM

## 2013-09-24 DIAGNOSIS — O479 False labor, unspecified: Secondary | ICD-10-CM | POA: Insufficient documentation

## 2013-09-24 DIAGNOSIS — R109 Unspecified abdominal pain: Secondary | ICD-10-CM | POA: Insufficient documentation

## 2013-09-24 DIAGNOSIS — Z87891 Personal history of nicotine dependence: Secondary | ICD-10-CM | POA: Insufficient documentation

## 2013-09-24 DIAGNOSIS — O98213 Gonorrhea complicating pregnancy, third trimester: Secondary | ICD-10-CM

## 2013-09-24 DIAGNOSIS — O10013 Pre-existing essential hypertension complicating pregnancy, third trimester: Secondary | ICD-10-CM

## 2013-09-24 DIAGNOSIS — O09293 Supervision of pregnancy with other poor reproductive or obstetric history, third trimester: Secondary | ICD-10-CM

## 2013-09-24 DIAGNOSIS — O09523 Supervision of elderly multigravida, third trimester: Secondary | ICD-10-CM

## 2013-09-24 MED ORDER — ONDANSETRON 8 MG PO TBDP
8.0000 mg | ORAL_TABLET | Freq: Once | ORAL | Status: AC
  Administered 2013-09-24: 8 mg via ORAL
  Filled 2013-09-24: qty 1

## 2013-09-24 NOTE — MAU Provider Note (Signed)
  History     CSN: [id number]  Arrival date and time: 09/24/13 2121   First Provider Initiated Contact with Patient 09/24/13 2219      Chief Complaint  Patient presents with  . Abdominal Pain   HPI Tanya Fry is a 42 y.o. [phone] at [redacted]w[redacted]d presents after eating a banana. Pt reports she became nauseated and started having palpitations and feeling jittery. Pt has hx of indigestion related to banana but no similar repsonses. Pt took benadryl 50mg  x1 and presented here ~1hr after benadryl. Pt was given zofran prior to this provider seeing her with improvement of symptoms. Now feeling back to baseline.  OB History   Grav Para Term Preterm Abortions TAB SAB Ect Mult Living   8 3 2 1 4  4   3       Past Medical History  Diagnosis Date  . Hypertension   . Anemia   . Postpartum cardiomyopathy     Past Surgical History  Procedure Laterality Date  . No past surgeries    . Cervical cerclage      Family History  Problem Relation Age of Onset  . Cancer Maternal Grandmother     breast cancer  . Cancer Paternal Grandfather     prostate    History  Substance Use Topics  . Smoking status: Former Smoker -- 0.50 packs/day  . Smokeless tobacco: Never Used  . Alcohol Use: Yes    Allergies:  Allergies  Allergen Reactions  . Shellfish Allergy Anaphylaxis    Prescriptions prior to admission  Medication Sig Dispense Refill  . acetaminophen (TYLENOL) 325 MG tablet Take 650 mg by mouth every 6 (six) hours as needed for pain. For pain      . diphenhydrAMINE (BENADRYL) 50 MG capsule Take 50 mg by mouth every 6 (six) hours as needed.      . Prenatal Vit-Fe Fumarate-FA (PRENATAL VITAMINS) 28-0.8 MG TABS Take 1 tablet by mouth.        ROS Physical Exam   Blood pressure 129/69, pulse 91, temperature 98.1 F (36.7 C), temperature source Oral, resp. rate 18, weight 107.684 kg (237 lb 6.4 oz), last menstrual period 01/03/2013, SpO2 98.00%.  Physical Exam VSS NAD CTAB no wrc RRR  no mgt Gravid NTTP. Dilation: 1 Effacement (%): Thick Cervical Position: Middle Station: -3 Presentation: Vertex Exam by:: B Mosca RN  FHT: 130s mod var, 2 accels >15x15 n o decels Toco:q8+min ctx  MAU Course  Procedures  MDM Pt doing well and stable will monitor 4 hrs after benadryl to ensure no recurrence of symptoms.  Assessment and Plan  Tanya Fry is a 42 y.o. [phone] at [redacted]w[redacted]d with reaction to banana that is likely resolved with benadryl. Does not appear to be anaphylactic. Will discharge pt home with return precautions for labor or recurring food reaction.  Jolyn Lent RYAN 09/24/2013, 11:08 PM

## 2013-09-24 NOTE — MAU Note (Addendum)
Pt reports that she was feeling completely fine. She ate a banana at 7:20 pm. Then 30-40 minutes later she started having pain in her stomach and felt like her heart was racing. Pt took 50 mg Benedryl. Pt states that "if I just throw up , I might feel better". Pt denies leaking or bleeding. Pt states that she feels like "things are calming down now". Pt denies her heart racing @ this time.

## 2013-09-25 ENCOUNTER — Encounter: Payer: Self-pay | Admitting: *Deleted

## 2013-09-25 ENCOUNTER — Encounter: Payer: Medicaid Other | Admitting: *Deleted

## 2013-09-25 NOTE — Progress Notes (Signed)
Pt was seen @ MAU last night and had reactive NST, therefore scheduled NST for today is not needed.  Next appt on 12/18 for Ob fu, NST and [place] for growth

## 2013-09-26 NOTE — Progress Notes (Signed)
Erroneous encounter

## 2013-09-27 NOTE — Progress Notes (Signed)
NST reviewed and reactive.  

## 2013-09-28 ENCOUNTER — Ambulatory Visit (INDEPENDENT_AMBULATORY_CARE_PROVIDER_SITE_OTHER): Payer: Medicaid Other | Admitting: Obstetrics & Gynecology

## 2013-09-28 ENCOUNTER — Ambulatory Visit (HOSPITAL_COMMUNITY)
Admission: RE | Admit: 2013-09-28 | Discharge: 2013-09-28 | Disposition: A | Discharge status: Home / Self Care | Payer: Medicaid Other | Source: Ambulatory Visit | Attending: Obstetrics and Gynecology | Admitting: Obstetrics and Gynecology

## 2013-09-28 VITALS — BP 131/79 | Temp 97.4°F | Wt 234.6 lb

## 2013-09-28 DIAGNOSIS — O3430 Maternal care for cervical incompetence, unspecified trimester: Secondary | ICD-10-CM | POA: Insufficient documentation

## 2013-09-28 DIAGNOSIS — O09213 Supervision of pregnancy with history of pre-term labor, third trimester: Secondary | ICD-10-CM

## 2013-09-28 DIAGNOSIS — O10019 Pre-existing essential hypertension complicating pregnancy, unspecified trimester: Secondary | ICD-10-CM

## 2013-09-28 DIAGNOSIS — Z8751 Personal history of pre-term labor: Secondary | ICD-10-CM | POA: Insufficient documentation

## 2013-09-28 DIAGNOSIS — O09293 Supervision of pregnancy with other poor reproductive or obstetric history, third trimester: Secondary | ICD-10-CM

## 2013-09-28 DIAGNOSIS — O10013 Pre-existing essential hypertension complicating pregnancy, third trimester: Secondary | ICD-10-CM

## 2013-09-28 DIAGNOSIS — O09523 Supervision of elderly multigravida, third trimester: Secondary | ICD-10-CM

## 2013-09-28 DIAGNOSIS — O0930 Supervision of pregnancy with insufficient antenatal care, unspecified trimester: Secondary | ICD-10-CM | POA: Insufficient documentation

## 2013-09-28 DIAGNOSIS — O09219 Supervision of pregnancy with history of pre-term labor, unspecified trimester: Secondary | ICD-10-CM

## 2013-09-28 DIAGNOSIS — O98213 Gonorrhea complicating pregnancy, third trimester: Secondary | ICD-10-CM

## 2013-09-28 DIAGNOSIS — O903 Peripartum cardiomyopathy: Secondary | ICD-10-CM

## 2013-09-28 DIAGNOSIS — E669 Obesity, unspecified: Secondary | ICD-10-CM | POA: Insufficient documentation

## 2013-09-28 LAB — POCT URINALYSIS DIP (DEVICE)
Leukocytes, UA: NEGATIVE
Nitrite: NEGATIVE
Protein, ur: 100 mg/dL — AB
Urobilinogen, UA: 0.2 mg/dL (ref 0.0–1.0)

## 2013-09-28 NOTE — Progress Notes (Signed)
P = 98 

## 2013-09-28 NOTE — Patient Instructions (Signed)
Labor Induction  Labor induction is when steps are taken to cause a pregnant woman to begin the labor process. Most women go into labor on their own between 37 weeks and 42 weeks of the pregnancy. When this does not happen or when there is a medical need, methods may be used to induce labor. Labor induction causes a pregnant woman's uterus to contract. It also causes the cervix to soften (ripen), open (dilate), and thin out (efface). Usually, labor is not induced before 39 weeks of the pregnancy unless there is a problem with the baby or mother.  Before inducing labor, your health care provider will consider a number of factors, including the following:  The medical condition of you and the baby.   How many weeks along you are.   The status of the baby's lung maturity.   The condition of the cervix.   The position of the baby.  WHAT ARE THE REASONS FOR LABOR INDUCTION? Labor may be induced for the following reasons:  The health of the baby or mother is at risk.   The pregnancy is overdue by 1 week or more.   The water breaks but labor does not start on its own.   The mother has a health condition or serious illness, such as high blood pressure, infection, placental abruption, or diabetes.  The amniotic fluid amounts are low around the baby.   The baby is distressed.  Convenience or wanting the baby to be born on a certain date is not a reason for inducing labor. WHAT METHODS ARE USED FOR LABOR INDUCTION? Several methods of labor induction may be used, such as:   Prostaglandin medicine. This medicine causes the cervix to dilate and ripen. The medicine will also start contractions. It can be taken by mouth or by inserting a suppository into the vagina.   Inserting a thin tube (catheter) with a balloon on the end into the vagina to dilate the cervix. Once inserted, the balloon is expanded with water, which causes the cervix to open.   Stripping the membranes. Your health  care provider separates amniotic sac tissue from the cervix, causing the cervix to be stretched and causing the release of a hormone called progesterone. This may cause the uterus to contract. It is often done during an office visit. You will be sent home to wait for the contractions to begin. You will then come in for an induction.   Breaking the water. Your health care provider makes a hole in the amniotic sac using a small instrument. Once the amniotic sac breaks, contractions should begin. This may still take hours to see an effect.   Medicine to trigger or strengthen contractions. This medicine is given through an IV access tube inserted into a vein in your arm.  All of the methods of induction, besides stripping the membranes, will be done in the hospital. Induction is done in the hospital so that you and the baby can be carefully monitored.  HOW LONG DOES IT TAKE FOR LABOR TO BE INDUCED? Some inductions can take up to 2 3 days. Depending on the cervix, it usually takes less time. It takes longer when you are induced early in the pregnancy or if this is your first pregnancy. If a mother is still pregnant and the induction has been going on for 2 3 days, either the mother will be sent home or a cesarean delivery will be needed. WHAT ARE THE RISKS ASSOCIATED WITH LABOR INDUCTION? Some of the risks   of induction include:   Changes in fetal heart rate, such as too high, too low, or erratic.   Fetal distress.   Chance of infection for the mother and baby.   Increased chance of having a cesarean delivery.   Breaking off (abruption) of the placenta from the uterus (rare).   Uterine rupture (very rare).  When induction is needed for medical reasons, the benefits of induction may outweigh the risks. WHAT ARE SOME REASONS FOR NOT INDUCING LABOR? Labor induction should not be done if:   It is shown that your baby does not tolerate labor.   You have had previous surgeries on your  uterus, such as a myomectomy or the removal of fibroids.   Your placenta lies very low in the uterus and blocks the opening of the cervix (placenta previa).   Your baby is not in a head-down position.   The umbilical cord drops down into the birth canal in front of the baby. This could cut off the baby's blood and oxygen supply.   You have had a previous cesarean delivery.   There are unusual circumstances, such as the baby being extremely premature.  Document Released: 02/17/2007 Document Revised: 05/31/2013 Document Reviewed: 04/27/2013 [employer] Patient Information 2014 [employer].  

## 2013-09-28 NOTE — Progress Notes (Signed)
[place] for growth today @ 1015

## 2013-09-28 NOTE — Progress Notes (Signed)
NST reactive, has [place] appt today. IOL 10/10/13

## 2013-09-29 ENCOUNTER — Telehealth (HOSPITAL_COMMUNITY): Payer: Self-pay | Admitting: *Deleted

## 2013-09-29 ENCOUNTER — Encounter (HOSPITAL_COMMUNITY): Payer: Self-pay | Admitting: *Deleted

## 2013-09-29 NOTE — MAU Provider Note (Signed)
`````  Attestation of Attending Supervision of Advanced Practitioner: Evaluation and management procedures were performed by the PA/NP/CNM/OB Fellow under my supervision/collaboration. Chart reviewed and agree with management and plan.  [place] Luppino V 09/29/2013 7:55 PM

## 2013-09-29 NOTE — Telephone Encounter (Signed)
Preadmission screen  

## 2013-10-02 ENCOUNTER — Encounter (HOSPITAL_COMMUNITY): Payer: Self-pay | Admitting: *Deleted

## 2013-10-02 ENCOUNTER — Inpatient Hospital Stay (HOSPITAL_COMMUNITY)
Admission: AD | Admit: 2013-10-02 | Discharge: 2013-10-04 | DRG: 767 | Disposition: A | Discharge status: Home / Self Care | Payer: Medicaid Other | Source: Ambulatory Visit | Attending: Obstetrics & Gynecology | Admitting: Obstetrics & Gynecology

## 2013-10-02 ENCOUNTER — Other Ambulatory Visit: Payer: Medicaid Other

## 2013-10-02 ENCOUNTER — Ambulatory Visit (HOSPITAL_COMMUNITY)
Admission: RE | Admit: 2013-10-02 | Discharge: 2013-10-02 | Disposition: A | Discharge status: Home / Self Care | Payer: Medicaid Other | Source: Ambulatory Visit | Attending: Obstetrics & Gynecology | Admitting: Obstetrics & Gynecology

## 2013-10-02 ENCOUNTER — Ambulatory Visit (INDEPENDENT_AMBULATORY_CARE_PROVIDER_SITE_OTHER): Payer: Medicaid Other | Admitting: *Deleted

## 2013-10-02 VITALS — BP 125/82

## 2013-10-02 DIAGNOSIS — O1002 Pre-existing essential hypertension complicating childbirth: Principal | ICD-10-CM | POA: Diagnosis present

## 2013-10-02 DIAGNOSIS — O98213 Gonorrhea complicating pregnancy, third trimester: Secondary | ICD-10-CM

## 2013-10-02 DIAGNOSIS — O903 Peripartum cardiomyopathy: Secondary | ICD-10-CM

## 2013-10-02 DIAGNOSIS — O10013 Pre-existing essential hypertension complicating pregnancy, third trimester: Secondary | ICD-10-CM

## 2013-10-02 DIAGNOSIS — O283 Abnormal ultrasonic finding on antenatal screening of mother: Secondary | ICD-10-CM | POA: Diagnosis present

## 2013-10-02 DIAGNOSIS — O10019 Pre-existing essential hypertension complicating pregnancy, unspecified trimester: Secondary | ICD-10-CM

## 2013-10-02 DIAGNOSIS — Z302 Encounter for sterilization: Secondary | ICD-10-CM

## 2013-10-02 DIAGNOSIS — O09529 Supervision of elderly multigravida, unspecified trimester: Secondary | ICD-10-CM | POA: Diagnosis present

## 2013-10-02 DIAGNOSIS — O99891 Other specified diseases and conditions complicating pregnancy: Secondary | ICD-10-CM | POA: Diagnosis present

## 2013-10-02 DIAGNOSIS — Z3490 Encounter for supervision of normal pregnancy, unspecified, unspecified trimester: Secondary | ICD-10-CM

## 2013-10-02 DIAGNOSIS — O99892 Other specified diseases and conditions complicating childbirth: Secondary | ICD-10-CM | POA: Diagnosis present

## 2013-10-02 DIAGNOSIS — Z22330 Carrier of Group B streptococcus: Secondary | ICD-10-CM | POA: Diagnosis not present

## 2013-10-02 DIAGNOSIS — I10 Essential (primary) hypertension: Secondary | ICD-10-CM | POA: Diagnosis present

## 2013-10-02 DIAGNOSIS — O09293 Supervision of pregnancy with other poor reproductive or obstetric history, third trimester: Secondary | ICD-10-CM

## 2013-10-02 LAB — CBC
HCT: 30 % — ABNORMAL LOW (ref 36.0–46.0)
Hemoglobin: 10.2 g/dL — ABNORMAL LOW (ref 12.0–15.0)
MCH: 30.9 pg (ref 26.0–34.0)
MCV: 90.9 fL (ref 78.0–100.0)
Platelets: 234 10*3/uL (ref 150–400)
RBC: 3.3 MIL/uL — ABNORMAL LOW (ref 3.87–5.11)
RDW: 13.6 % (ref 11.5–15.5)

## 2013-10-02 LAB — TYPE AND SCREEN: Antibody Screen: NEGATIVE

## 2013-10-02 MED ORDER — ONDANSETRON HCL 4 MG/2ML IJ SOLN: 4.0000 mg | Freq: Four times a day (QID) | INTRAMUSCULAR | Status: DC | PRN

## 2013-10-02 MED ORDER — LACTATED RINGERS IV SOLN
INTRAVENOUS | Status: DC
  Administered 2013-10-02 – 2013-10-03 (×5): via INTRAVENOUS

## 2013-10-02 MED ORDER — IBUPROFEN 600 MG PO TABS: 600.0000 mg | ORAL_TABLET | Freq: Four times a day (QID) | ORAL | Status: DC | PRN

## 2013-10-02 MED ORDER — PENICILLIN G POTASSIUM 5000000 UNITS IJ SOLR
2.5000 10*6.[IU] | INTRAVENOUS | Status: DC
  Filled 2013-10-02 (×2): qty 2.5

## 2013-10-02 MED ORDER — LIDOCAINE HCL (PF) 1 % IJ SOLN
30.0000 mL | INTRAMUSCULAR | Status: DC | PRN
  Filled 2013-10-02: qty 30

## 2013-10-02 MED ORDER — OXYCODONE-ACETAMINOPHEN 5-325 MG PO TABS: 1.0000 | ORAL_TABLET | ORAL | Status: DC | PRN

## 2013-10-02 MED ORDER — CITRIC ACID-SODIUM CITRATE 334-500 MG/5ML PO SOLN
30.0000 mL | ORAL | Status: DC | PRN
  Administered 2013-10-03: 30 mL via ORAL
  Filled 2013-10-02: qty 15

## 2013-10-02 MED ORDER — ZOLPIDEM TARTRATE 5 MG PO TABS
5.0000 mg | ORAL_TABLET | Freq: Every evening | ORAL | Status: DC | PRN
  Administered 2013-10-02: 5 mg via ORAL
  Filled 2013-10-02: qty 1

## 2013-10-02 MED ORDER — OXYTOCIN 40 UNITS IN LACTATED RINGERS INFUSION - SIMPLE MED
62.5000 mL/h | INTRAVENOUS | Status: DC
  Filled 2013-10-02: qty 1000

## 2013-10-02 MED ORDER — ACETAMINOPHEN 325 MG PO TABS: 650.0000 mg | ORAL_TABLET | ORAL | Status: DC | PRN

## 2013-10-02 MED ORDER — LACTATED RINGERS IV SOLN: 500.0000 mL | INTRAVENOUS | Status: DC | PRN

## 2013-10-02 MED ORDER — OXYTOCIN BOLUS FROM INFUSION
500.0000 mL | INTRAVENOUS | Status: DC
  Administered 2013-10-03: 500 mL via INTRAVENOUS

## 2013-10-02 MED ORDER — PENICILLIN G POTASSIUM 5000000 UNITS IJ SOLR
5.0000 10*6.[IU] | Freq: Once | INTRAVENOUS | Status: DC
  Filled 2013-10-02: qty 5

## 2013-10-02 MED ORDER — MISOPROSTOL 25 MCG QUARTER TABLET
25.0000 ug | ORAL_TABLET | ORAL | Status: DC
  Administered 2013-10-02 (×2): 25 ug via VAGINAL
  Filled 2013-10-02 (×2): qty 0.25

## 2013-10-02 NOTE — H&P (Signed)
Tanya Fry is a 42 y.o. female presenting for Abnormal BPP. Maternal Medical History:  Reason for admission: Nausea.  Contractions: Frequency: rare.   Perceived severity is mild.    Fetal activity: Perceived fetal activity is normal.   Last perceived fetal movement was within the past hour.    Prenatal complications: PIH, preterm labor and substance abuse.   No bleeding.   Prenatal Complications - Diabetes: none.    OB History   Grav Para Term Preterm Abortions TAB SAB Ect Mult Living   8 3 2 1 4  4   3      Past Medical History  Diagnosis Date  . Hypertension   . Anemia   . Postpartum cardiomyopathy    Past Surgical History  Procedure Laterality Date  . No past surgeries    . Cervical cerclage     Family History: family history includes Cancer in her maternal grandmother and paternal grandfather. Social History:  reports that she quit smoking about 9 months ago. She has never used smokeless tobacco. She reports that she drinks alcohol. She reports that she does not use illicit drugs.   Prenatal Transfer Tool  Maternal Diabetes: No Genetic Screening: Declined Maternal Ultrasounds/Referrals: Normal Fetal Ultrasounds or other Referrals:  None Maternal Substance Abuse:  Yes:  Type: Other:  Benzos in past Significant Maternal Medications:  None Significant Maternal Lab Results:  None Other Comments:  GBS Positive  Review of Systems  Constitutional: Negative for fever and chills.  Gastrointestinal: Negative for nausea, vomiting, abdominal pain, diarrhea and constipation.  Neurological: Negative for dizziness and headaches.    Dilation: 1 Effacement (%): Thick Station: -3;Ballotable Exam by:: Artelia Laroche CNM Blood pressure 119/82, pulse 101, temperature 98.2 F (36.8 C), temperature source Oral, resp. rate 16, last menstrual period 01/03/2013. Maternal Exam:  Uterine Assessment: Contraction strength is mild.  Contraction frequency is irregular.   Abdomen:  Fundal height is 38.   Estimated fetal weight is 7.5.   Fetal presentation: vertex  Introitus: Normal vulva. Normal vagina.  Vagina is negative for discharge.  Ferning test: not done.  Nitrazine test: not done. Amniotic fluid character: not assessed.  Pelvis: adequate for delivery.   Cervix: Cervix evaluated by digital exam.     Fetal Exam Fetal Monitor Review: Mode: ultrasound.   Baseline rate: 140.  Variability: moderate (6-25 bpm).   Pattern: no decelerations.       Physical Exam  Constitutional: She is oriented to person, place, and time. She appears well-developed and well-nourished. No distress.  HENT:  Head: Normocephalic.  Cardiovascular: Normal rate.   Respiratory: Effort normal.  GI: Soft. She exhibits no distension. There is no tenderness. There is no rebound and no guarding.  Genitourinary: Vagina normal and uterus normal. No vaginal discharge found.  Dilation: 1 Effacement (%): Thick Cervical Position: Posterior Station: -3;Ballotable Presentation: Vertex Exam by:: Artelia Laroche CNM   Musculoskeletal: Normal range of motion.  Neurological: She is alert and oriented to person, place, and time.  Skin: Skin is warm and dry.  Psychiatric: She has a normal mood and affect.    Prenatal labs: ABO, Rh: O/POS/-- (10/22 1431) Antibody: NEG (10/22 1431) Rubella: 5.00 (10/22 1431) RPR: NON REAC (10/22 1431)  HBsAg: NEGATIVE (10/22 1431)  HIV: NON REACTIVE (10/22 1431)  GBS: Positive (12/01 0000)   Assessment/Plan: A:  SIUP at [redacted]w[redacted]d       BPP 6/10       P:  MFM recommends admission and induction of labor  Cervix not favorable      Will start with Cytotec  [facility] 10/02/2013, 5:04 PM

## 2013-10-02 NOTE — Progress Notes (Signed)
P = 96   Pt sent to [place] dept. For BPP due to NR NST.  Dr. Marice Potter notified.

## 2013-10-03 ENCOUNTER — Encounter (HOSPITAL_COMMUNITY): Payer: Medicaid Other | Admitting: Anesthesiology

## 2013-10-03 ENCOUNTER — Encounter (HOSPITAL_COMMUNITY)
Admission: AD | Disposition: A | Discharge status: Home / Self Care | Payer: Self-pay | Source: Ambulatory Visit | Attending: Obstetrics & Gynecology

## 2013-10-03 ENCOUNTER — Encounter: Payer: Self-pay | Admitting: Family Medicine

## 2013-10-03 ENCOUNTER — Encounter (HOSPITAL_COMMUNITY): Payer: Self-pay | Admitting: Anesthesiology

## 2013-10-03 ENCOUNTER — Inpatient Hospital Stay (HOSPITAL_COMMUNITY): Payer: Medicaid Other | Admitting: Anesthesiology

## 2013-10-03 DIAGNOSIS — O9989 Other specified diseases and conditions complicating pregnancy, childbirth and the puerperium: Secondary | ICD-10-CM

## 2013-10-03 DIAGNOSIS — O99892 Other specified diseases and conditions complicating childbirth: Secondary | ICD-10-CM

## 2013-10-03 DIAGNOSIS — Z302 Encounter for sterilization: Secondary | ICD-10-CM

## 2013-10-03 DIAGNOSIS — Z3490 Encounter for supervision of normal pregnancy, unspecified, unspecified trimester: Secondary | ICD-10-CM

## 2013-10-03 DIAGNOSIS — O1002 Pre-existing essential hypertension complicating childbirth: Secondary | ICD-10-CM

## 2013-10-03 DIAGNOSIS — O09529 Supervision of elderly multigravida, unspecified trimester: Secondary | ICD-10-CM

## 2013-10-03 HISTORY — PX: TUBAL LIGATION: SHX77

## 2013-10-03 SURGERY — LIGATION, FALLOPIAN TUBE, POSTPARTUM
Anesthesia: Epidural | Site: Abdomen | Laterality: Bilateral

## 2013-10-03 MED ORDER — FAMOTIDINE 20 MG PO TABS
20.0000 mg | ORAL_TABLET | Freq: Once | ORAL | Status: AC
  Administered 2013-10-03: 20 mg via ORAL
  Filled 2013-10-03: qty 1

## 2013-10-03 MED ORDER — PRENATAL MULTIVITAMIN CH
1.0000 | ORAL_TABLET | Freq: Every day | ORAL | Status: DC
  Administered 2013-10-04: 1 via ORAL
  Filled 2013-10-03: qty 1

## 2013-10-03 MED ORDER — FENTANYL 2.5 MCG/ML BUPIVACAINE 1/10 % EPIDURAL INFUSION (WH - ANES)
14.0000 mL/h | INTRAMUSCULAR | Status: DC | PRN
  Filled 2013-10-03: qty 125

## 2013-10-03 MED ORDER — ZOLPIDEM TARTRATE 5 MG PO TABS: 5.0000 mg | ORAL_TABLET | Freq: Every evening | ORAL | Status: DC | PRN

## 2013-10-03 MED ORDER — DIPHENHYDRAMINE HCL 50 MG/ML IJ SOLN: 12.5000 mg | INTRAMUSCULAR | Status: DC | PRN

## 2013-10-03 MED ORDER — PHENYLEPHRINE 40 MCG/ML (10ML) SYRINGE FOR IV PUSH (FOR BLOOD PRESSURE SUPPORT): 80.0000 ug | PREFILLED_SYRINGE | INTRAVENOUS | Status: DC | PRN

## 2013-10-03 MED ORDER — SODIUM BICARBONATE 8.4 % IV SOLN
INTRAVENOUS | Status: AC
  Filled 2013-10-03: qty 50

## 2013-10-03 MED ORDER — FENTANYL CITRATE 0.05 MG/ML IJ SOLN
100.0000 ug | INTRAMUSCULAR | Status: DC | PRN
  Administered 2013-10-03: 100 ug via INTRAVENOUS
  Filled 2013-10-03: qty 2

## 2013-10-03 MED ORDER — BENZOCAINE-MENTHOL 20-0.5 % EX AERO: 1.0000 "application " | INHALATION_SPRAY | CUTANEOUS | Status: DC | PRN

## 2013-10-03 MED ORDER — SIMETHICONE 80 MG PO CHEW: 80.0000 mg | CHEWABLE_TABLET | ORAL | Status: DC | PRN

## 2013-10-03 MED ORDER — LANOLIN HYDROUS EX OINT: TOPICAL_OINTMENT | CUTANEOUS | Status: DC | PRN

## 2013-10-03 MED ORDER — BUPIVACAINE HCL (PF) 0.5 % IJ SOLN
INTRAMUSCULAR | Status: AC
  Filled 2013-10-03: qty 30

## 2013-10-03 MED ORDER — ONDANSETRON HCL 4 MG/2ML IJ SOLN: 4.0000 mg | INTRAMUSCULAR | Status: DC | PRN

## 2013-10-03 MED ORDER — PENICILLIN G POTASSIUM 5000000 UNITS IJ SOLR
5.0000 10*6.[IU] | Freq: Once | INTRAMUSCULAR | Status: AC
  Administered 2013-10-03: 5 10*6.[IU] via INTRAVENOUS
  Filled 2013-10-03: qty 5

## 2013-10-03 MED ORDER — LACTATED RINGERS IV SOLN
500.0000 mL | Freq: Once | INTRAVENOUS | Status: AC
  Administered 2013-10-03: 500 mL via INTRAVENOUS

## 2013-10-03 MED ORDER — MEASLES, MUMPS & RUBELLA VAC ~~LOC~~ INJ
0.5000 mL | INJECTION | Freq: Once | SUBCUTANEOUS | Status: DC
  Filled 2013-10-03: qty 0.5

## 2013-10-03 MED ORDER — FENTANYL CITRATE 0.05 MG/ML IJ SOLN: 25.0000 ug | INTRAMUSCULAR | Status: DC | PRN

## 2013-10-03 MED ORDER — DIBUCAINE 1 % RE OINT: 1.0000 "application " | TOPICAL_OINTMENT | RECTAL | Status: DC | PRN

## 2013-10-03 MED ORDER — IBUPROFEN 600 MG PO TABS
600.0000 mg | ORAL_TABLET | Freq: Four times a day (QID) | ORAL | Status: DC
  Administered 2013-10-03 – 2013-10-04 (×5): 600 mg via ORAL
  Filled 2013-10-03 (×5): qty 1

## 2013-10-03 MED ORDER — WITCH HAZEL-GLYCERIN EX PADS: 1.0000 "application " | MEDICATED_PAD | CUTANEOUS | Status: DC | PRN

## 2013-10-03 MED ORDER — SENNOSIDES-DOCUSATE SODIUM 8.6-50 MG PO TABS
2.0000 | ORAL_TABLET | ORAL | Status: DC
  Administered 2013-10-03: 2 via ORAL
  Filled 2013-10-03: qty 2

## 2013-10-03 MED ORDER — KETOROLAC TROMETHAMINE 30 MG/ML IJ SOLN
INTRAMUSCULAR | Status: AC
  Filled 2013-10-03: qty 1

## 2013-10-03 MED ORDER — KETOROLAC TROMETHAMINE 30 MG/ML IJ SOLN
INTRAMUSCULAR | Status: DC | PRN
  Administered 2013-10-03: 30 mg via INTRAVENOUS

## 2013-10-03 MED ORDER — FENTANYL CITRATE 0.05 MG/ML IJ SOLN
INTRAMUSCULAR | Status: DC | PRN
  Administered 2013-10-03: 100 ug via INTRAVENOUS

## 2013-10-03 MED ORDER — TETANUS-DIPHTH-ACELL PERTUSSIS 5-2.5-18.5 LF-MCG/0.5 IM SUSP: 0.5000 mL | Freq: Once | INTRAMUSCULAR | Status: DC

## 2013-10-03 MED ORDER — LIDOCAINE-EPINEPHRINE (PF) 2 %-1:200000 IJ SOLN
INTRAMUSCULAR | Status: AC
  Filled 2013-10-03: qty 20

## 2013-10-03 MED ORDER — EPHEDRINE 5 MG/ML INJ: 10.0000 mg | INTRAVENOUS | Status: DC | PRN

## 2013-10-03 MED ORDER — FENTANYL CITRATE 0.05 MG/ML IJ SOLN
INTRAMUSCULAR | Status: AC
  Filled 2013-10-03: qty 2

## 2013-10-03 MED ORDER — EPHEDRINE 5 MG/ML INJ
10.0000 mg | INTRAVENOUS | Status: DC | PRN
  Filled 2013-10-03: qty 4

## 2013-10-03 MED ORDER — ONDANSETRON HCL 4 MG/2ML IJ SOLN
INTRAMUSCULAR | Status: AC
  Filled 2013-10-03: qty 2

## 2013-10-03 MED ORDER — FENTANYL 2.5 MCG/ML BUPIVACAINE 1/10 % EPIDURAL INFUSION (WH - ANES)
INTRAMUSCULAR | Status: DC | PRN
  Administered 2013-10-03: 14 mL/h via EPIDURAL

## 2013-10-03 MED ORDER — ONDANSETRON HCL 4 MG/2ML IJ SOLN
INTRAMUSCULAR | Status: DC | PRN
  Administered 2013-10-03: 4 mg via INTRAVENOUS

## 2013-10-03 MED ORDER — OXYCODONE-ACETAMINOPHEN 5-325 MG PO TABS
1.0000 | ORAL_TABLET | ORAL | Status: DC | PRN
  Administered 2013-10-03 – 2013-10-04 (×4): 1 via ORAL
  Filled 2013-10-03 (×3): qty 1

## 2013-10-03 MED ORDER — PENICILLIN G POTASSIUM 5000000 UNITS IJ SOLR
2.5000 10*6.[IU] | INTRAVENOUS | Status: DC
  Filled 2013-10-03 (×3): qty 2.5

## 2013-10-03 MED ORDER — SODIUM BICARBONATE 8.4 % IV SOLN
INTRAVENOUS | Status: DC | PRN
  Administered 2013-10-03 (×4): 5 mL via EPIDURAL

## 2013-10-03 MED ORDER — ONDANSETRON HCL 4 MG PO TABS: 4.0000 mg | ORAL_TABLET | ORAL | Status: DC | PRN

## 2013-10-03 MED ORDER — DIPHENHYDRAMINE HCL 25 MG PO CAPS: 25.0000 mg | ORAL_CAPSULE | Freq: Four times a day (QID) | ORAL | Status: DC | PRN

## 2013-10-03 MED ORDER — PHENYLEPHRINE 40 MCG/ML (10ML) SYRINGE FOR IV PUSH (FOR BLOOD PRESSURE SUPPORT)
80.0000 ug | PREFILLED_SYRINGE | INTRAVENOUS | Status: DC | PRN
  Filled 2013-10-03: qty 10

## 2013-10-03 MED ORDER — MIDAZOLAM HCL 5 MG/5ML IJ SOLN
INTRAMUSCULAR | Status: DC | PRN
  Administered 2013-10-03: 2 mg via INTRAVENOUS

## 2013-10-03 MED ORDER — MIDAZOLAM HCL 2 MG/2ML IJ SOLN
INTRAMUSCULAR | Status: AC
  Filled 2013-10-03: qty 2

## 2013-10-03 MED ORDER — LIDOCAINE HCL (PF) 1 % IJ SOLN
INTRAMUSCULAR | Status: DC | PRN
  Administered 2013-10-03 (×2): 9 mL

## 2013-10-03 MED ORDER — BUPIVACAINE HCL (PF) 0.5 % IJ SOLN
INTRAMUSCULAR | Status: DC | PRN
  Administered 2013-10-03: 30 mL

## 2013-10-03 SURGICAL SUPPLY — 28 items
ADH SKN CLS APL DERMABOND .7 (GAUZE/BANDAGES/DRESSINGS) ×1
APL SKNCLS STERI-STRIP NONHPOA (GAUZE/BANDAGES/DRESSINGS)
BENZOIN TINCTURE PRP APPL 2/3 (GAUZE/BANDAGES/DRESSINGS) IMPLANT
BLADE SURG 11 STRL SS (BLADE) ×2 IMPLANT
CHLORAPREP W/TINT 26ML (MISCELLANEOUS) ×2 IMPLANT
CLIP FILSHIE TUBAL LIGA STRL (Clip) ×3 IMPLANT
CLOTH BEACON ORANGE TIMEOUT ST (SAFETY) ×2 IMPLANT
DERMABOND ADVANCED (GAUZE/BANDAGES/DRESSINGS) ×1
DERMABOND ADVANCED .7 DNX12 (GAUZE/BANDAGES/DRESSINGS) IMPLANT
ELECT REM PT RETURN 9FT ADLT (ELECTROSURGICAL) ×2
ELECTRODE REM PT RTRN 9FT ADLT (ELECTROSURGICAL) ×1 IMPLANT
GLOVE BIO SURGEON STRL SZ7 (GLOVE) ×2 IMPLANT
GLOVE BIOGEL PI IND STRL 7.0 (GLOVE) ×2 IMPLANT
GLOVE BIOGEL PI INDICATOR 7.0 (GLOVE) ×2
GOWN PREVENTION PLUS LG XLONG (DISPOSABLE) ×4 IMPLANT
NDL HYPO 25X1 1.5 SAFETY (NEEDLE) ×1 IMPLANT
NEEDLE HYPO 25X1 1.5 SAFETY (NEEDLE) ×2 IMPLANT
NS IRRIG 1000ML POUR BTL (IV SOLUTION) ×2 IMPLANT
PACK ABDOMINAL MINOR (CUSTOM PROCEDURE TRAY) ×2 IMPLANT
PENCIL BUTTON HOLSTER BLD 10FT (ELECTRODE) ×2 IMPLANT
SPONGE LAP 4X18 X RAY DECT (DISPOSABLE) IMPLANT
SUT VIC AB 0 CT1 27 (SUTURE) ×2
SUT VIC AB 0 CT1 27XBRD ANBCTR (SUTURE) ×1 IMPLANT
SUT VICRYL 4-0 PS2 18IN ABS (SUTURE) ×2 IMPLANT
SYR CONTROL 10ML LL (SYRINGE) ×2 IMPLANT
TOWEL OR 17X24 6PK STRL BLUE (TOWEL DISPOSABLE) ×4 IMPLANT
TRAY FOLEY CATH 14FR (SET/KITS/TRAYS/PACK) ×2 IMPLANT
WATER STERILE IRR 1000ML POUR (IV SOLUTION) ×2 IMPLANT

## 2013-10-03 NOTE — Anesthesia Procedure Notes (Signed)
Epidural Patient location during procedure: OB Start time: 10/03/2013 2:34 AM End time: 10/03/2013 2:38 AM  Staffing Anesthesiologist: Leilani Able Performed by: anesthesiologist   Preanesthetic Checklist Completed: patient identified, surgical consent, pre-op evaluation, timeout performed, IV checked, risks and benefits discussed and monitors and equipment checked  Epidural Patient position: sitting Prep: site prepped and draped and DuraPrep Patient monitoring: continuous pulse ox and blood pressure Approach: midline Injection technique: LOR air  Needle:  Needle type: Tuohy  Needle gauge: 17 G Needle length: 9 cm and 9 Needle insertion depth: 7 cm Catheter type: closed end flexible Catheter size: 19 Gauge Catheter at skin depth: 13 cm Test dose: negative and Other  Assessment Sensory level: T9 Events: blood not aspirated, injection not painful, no injection resistance, negative IV test and no paresthesia  Additional Notes Reason for block:procedure for pain

## 2013-10-03 NOTE — Progress Notes (Signed)
Tanya Fry is a 42 y.o. [phone] at [redacted]w[redacted]d admitted for induction of labor due to BPP of 6/10.  Subjective:  Pt more and more uncomfortable. Wanting an epidural. +FM  Objective: BP 117/79  Pulse 82  Temp(Src) 98 F (36.7 C) (Oral)  Resp 20  Ht 5' 6.5" (1.689 m)  Wt 106.142 kg (234 lb)  BMI 37.21 kg/m2  SpO2 100%  LMP 01/03/2013      FHT:  FHR: 140 bpm, variability: minimal ,  accelerations:  Abscent,  decelerations:  Present early UC:   regular, every 2-3 minutes SVE:   Dilation: 6 Effacement (%): 80 Station: +2 Exam by:: [employer]: Lab Results  Component Value Date   WBC 8.7 10/02/2013   HGB 10.2* 10/02/2013   HCT 30.0* 10/02/2013   MCV 90.9 10/02/2013   PLT 234 10/02/2013    Assessment / Plan: IOL for BPP of 6/10. s/p cytotec   Labor: now laboring on her own, cont to monitor for now Fetal Wellbeing:  Category II Pain Control:  Epidural I/D:  on PCN Anticipated MOD:  NSVD  Saundra Gin L 10/03/2013, 3:50 AM

## 2013-10-03 NOTE — Progress Notes (Signed)
Faculty Practice OB/GYN Attending Note  42 y.o. [phone] s/p SVD of viable female infant today at 65, desires permanent sterilization.  Other reversible forms of contraception were discussed with patient; she declines all other modalities. Risks of procedure discussed with patient including but not limited to: risk of regret, permanence of method, bleeding, infection, injury to surrounding organs and need for additional procedures.  Failure risk of 1-2 % with increased risk of ectopic gestation if pregnancy occurs was also discussed with patient.  Patient verbalized understanding of these risks and wants to proceed with sterilization.  Written informed consent obtained.  To OR when ready for postpartum bilateral tubal sterilization with Filshie clips.   Jaynie Collins, MD, FACOG Attending Obstetrician & Gynecologist Faculty Practice, [facility]

## 2013-10-03 NOTE — Anesthesia Postprocedure Evaluation (Signed)
  Anesthesia Post-op Note  Patient: Tanya Fry  Procedure(s) Performed: Procedure(s): POST PARTUM TUBAL LIGATION (Bilateral)  Patient Location: Mother/Baby  Anesthesia Type:Epidural  Level of Consciousness: awake, alert  and oriented  Airway and Oxygen Therapy: Patient Spontanous Breathing  Post-op Pain: none  Post-op Assessment: Post-op Vital signs reviewed and Patient's Cardiovascular Status Stable  Post-op Vital Signs: Reviewed and stable  Complications: No apparent anesthesia complications

## 2013-10-03 NOTE — Anesthesia Postprocedure Evaluation (Signed)
  Anesthesia Post-op Note  Patient: Tanya Fry  Procedure(s) Performed: Procedure(s): POST PARTUM TUBAL LIGATION (Bilateral)  Patient is awake, responsive, moving her legs, and has signs of resolution of her numbness. Pain and nausea are reasonably well controlled. Vital signs are stable and clinically acceptable. Oxygen saturation is clinically acceptable. There are no apparent anesthetic complications at this time. Patient is ready for discharge.

## 2013-10-03 NOTE — Progress Notes (Signed)
Pt for BTL, anesthesia notified epidural taped to shoulder, RN to give sodium Citrate.  Nursery called and informed.  OR time confirmed for 1245 procedure with patient to be picked up at 1145.

## 2013-10-03 NOTE — Progress Notes (Signed)
Tanya Fry is a 42 y.o. [phone] at [redacted]w[redacted]d admitted for induction of labor due to nonreassuring fetal status with a BPP of 6/10.  Subjective:  Pt starting to hurt more. Wanting IV pain medicine. +FM.    Objective: BP 130/74  Pulse 78  Temp(Src) 98 F (36.7 C) (Oral)  Resp 20  Ht 5' 6.5" (1.689 m)  Wt 106.142 kg (234 lb)  BMI 37.21 kg/m2  LMP 01/03/2013      FHT:  FHR: 155 bpm, variability: minimal ,  accelerations:  Present,  decelerations:  Present variables and occasional, nonconsecutive lates UC:   irregular, every 2-5 minutes SVE:   Dilation: 3 Effacement (%): 90 Station: -2 Exam by:: Tanya Coots, RN  Labs: Lab Results  Component Value Date   WBC 8.7 10/02/2013   HGB 10.2* 10/02/2013   HCT 30.0* 10/02/2013   MCV 90.9 10/02/2013   PLT 234 10/02/2013    Assessment / Plan: IOL due to BPP of 6/10, s/p cytotec x1  Labor: cytotec x2 placed at 2330 and now with cervical change.  Fetal Wellbeing:  Category II Pain Control:  Fentanyl I/D:  Positive- start PCN Anticipated MOD:  NSVD  Tanya Fry L 10/03/2013, 1:48 AM

## 2013-10-03 NOTE — Progress Notes (Signed)
UR completed 

## 2013-10-03 NOTE — Transfer of Care (Signed)
Immediate Anesthesia Transfer of Care Note  Patient: Tanya Fry  Procedure(s) Performed: Procedure(s): POST PARTUM TUBAL LIGATION (Bilateral)  Patient Location: PACU  Anesthesia Type:Epidural  Level of Consciousness: awake, alert  and oriented  Airway & Oxygen Therapy: Patient Spontanous Breathing  Post-op Assessment: Report given to PACU RN and Post -op Vital signs reviewed and stable  Post vital signs: Reviewed and stable  Complications: No apparent anesthesia complications

## 2013-10-03 NOTE — Lactation Note (Signed)
This note was copied from the chart of Tanya Airyanna Dipalma. Lactation Consultation Note Initial visit at 15 hours of age.  Wh LC resources given and discussed.  Mom reports baby has been to sleepy to latch.  She has previous experience with 2 older children breastfeeding approximately 1 month each.  Mom has large  Left nipple that invert with compression, but hand pump pulls out momentarily.  Hand expression demonstrated without any return colostrum visible.  Right nipple has a slight inversion on the tip with erect nipple.  Areola is not compressible and tough.  Reverse pressure demonstrated with minimal improvement.   Assisted with latch attempt in football hold on left breast after hand expression and hand pump.  Baby will suck on gloved finger, but to sleepy to latch at this time.  Encouraged frequent skin to skin and  Feeding with early cues.  Mom to call for assist as needed.   Patient Name: Tanya Fry [id number] Date: 10/03/2013 Reason for consult: Initial assessment   Maternal Data Formula Feeding for Exclusion: No Infant to breast within first hour of birth: Yes Has patient been taught Hand Expression?: Yes Does the patient have breastfeeding experience prior to this delivery?: Yes  Feeding Feeding Type: Breast Fed Length of feed: 0 min  LATCH Score/Interventions Latch: Too sleepy or reluctant, no latch achieved, no sucking elicited. Intervention(s): Waking techniques;Teach feeding cues;Skin to skin Intervention(s): Adjust position;Assist with latch;Breast massage;Breast compression  Audible Swallowing: None Intervention(s): Hand expression  Type of Nipple: Flat Intervention(s): Hand pump  Comfort (Breast/Nipple): Soft / non-tender     Hold (Positioning): No assistance needed to correctly position infant at breast. Intervention(s): Breastfeeding basics reviewed;Support Pillows;Position options;Skin to skin  LATCH Score: 5  Lactation Tools Discussed/Used      Consult Status Consult Status: Follow-up Date: 10/04/13 Follow-up type: In-patient    Jannifer Rodney 10/03/2013, 8:49 PM

## 2013-10-03 NOTE — Progress Notes (Deleted)
Tanya Fry is a 41 y.o. [phone] at [redacted]w[redacted]d by ultrasound admitted for rupture of membranes  Subjective: Feeling pain in "my butt"  Objective: BP 131/79  Pulse 77  Temp(Src) 97.9 F (36.6 C) (Oral)  Resp 20  Ht 5' 6.5" (1.689 m)  Wt 106.142 kg (234 lb)  BMI 37.21 kg/m2  SpO2 100%  LMP 01/03/2013 I/O last 3 completed shifts: In: -  Out: 300 [Urine:50; Blood:250]    FHT:  FHR: 140 bpm, variability: moderate,  accelerations:  Present,  decelerations:  Absent UC:   regular, every 2 minutes SVE:   Dilation: 10 Effacement (%): 100 Station: 0 Exam by:: [employer]: Lab Results  Component Value Date   WBC 8.7 10/02/2013   HGB 10.2* 10/02/2013   HCT 30.0* 10/02/2013   MCV 90.9 10/02/2013   PLT 234 10/02/2013    Assessment / Plan: Augmentation of labor, progressing well Second Stage  Labor: Progressing normally Will begin pushing and prepare for delivery Preeclampsia:  n/a Fetal Wellbeing:  Category I Pain Control:  Epidural I/D:  n/a Anticipated MOD:  NSVD  Jody Silas 10/03/2013, 9:27 AM

## 2013-10-03 NOTE — Anesthesia Preprocedure Evaluation (Signed)
Anesthesia Evaluation  Patient identified by MRN, date of birth, ID band Patient awake    Reviewed: Allergy & Precautions, H&P , NPO status , Patient's Chart, lab work & pertinent test results  Airway Mallampati: II TM Distance: >3 FB Neck ROM: full    Dental no notable dental hx.    Pulmonary former smoker,  breath sounds clear to auscultation  Pulmonary exam normal       Cardiovascular Exercise Tolerance: Good hypertension, Rhythm:regular Rate:Normal     Neuro/Psych negative neurological ROS  negative psych ROS   GI/Hepatic negative GI ROS, Neg liver ROS,   Endo/Other  negative endocrine ROSMorbid obesity  Renal/GU negative Renal ROS  negative genitourinary   Musculoskeletal negative musculoskeletal ROS (+)   Abdominal Normal abdominal exam  (+)   Peds  Hematology negative hematology ROS (+)   Anesthesia Other Findings   Reproductive/Obstetrics (+) Pregnancy                           Anesthesia Physical  Anesthesia Plan  ASA: III  Anesthesia Plan: Epidural   Post-op Pain Management:    Induction:   Airway Management Planned:   Additional Equipment:   Intra-op Plan:   Post-operative Plan:   Informed Consent: I have reviewed the patients History and Physical, chart, labs and discussed the procedure including the risks, benefits and alternatives for the proposed anesthesia with the patient or authorized representative who has indicated his/her understanding and acceptance.   Dental Advisory Given  Plan Discussed with:   Anesthesia Plan Comments: (Labs checked- platelets confirmed with RN in room. Fetal heart tracing, per RN, reported to be stable enough for sitting procedure. Discussed epidural, and patient consents to the procedure:  included risk of possible headache,backache, failed block, allergic reaction, and nerve injury. This patient was asked if she had any questions  or concerns before the procedure started.)        Anesthesia Quick Evaluation

## 2013-10-03 NOTE — Anesthesia Preprocedure Evaluation (Signed)
Anesthesia Evaluation  Patient identified by MRN, date of birth, ID band Patient awake    Reviewed: Allergy & Precautions, H&P , NPO status , Patient's Chart, lab work & pertinent test results  Airway Mallampati: II TM Distance: >3 FB Neck ROM: full    Dental no notable dental hx.    Pulmonary former smoker,    Pulmonary exam normal       Cardiovascular hypertension,     Neuro/Psych negative neurological ROS  negative psych ROS   GI/Hepatic negative GI ROS, Neg liver ROS,   Endo/Other  negative endocrine ROS  Renal/GU negative Renal ROS  negative genitourinary   Musculoskeletal negative musculoskeletal ROS (+)   Abdominal Normal abdominal exam  (+)   Peds  Hematology negative hematology ROS (+)   Anesthesia Other Findings   Reproductive/Obstetrics (+) Pregnancy                           Anesthesia Physical Anesthesia Plan  ASA: II  Anesthesia Plan: Epidural   Post-op Pain Management:    Induction:   Airway Management Planned:   Additional Equipment:   Intra-op Plan:   Post-operative Plan:   Informed Consent: I have reviewed the patients History and Physical, chart, labs and discussed the procedure including the risks, benefits and alternatives for the proposed anesthesia with the patient or authorized representative who has indicated his/her understanding and acceptance.     Plan Discussed with:   Anesthesia Plan Comments:         Anesthesia Quick Evaluation

## 2013-10-03 NOTE — Op Note (Signed)
Tanya Fry 10/02/2013 - 10/03/2013  PREOPERATIVE DIAGNOSES: Multiparity, undesired fertility  POSTOPERATIVE DIAGNOSES: Multiparity, undesired fertility  PROCEDURE:  Postpartum Bilateral Tubal Sterilization using Filshie Clips   SURGEON: Dr.  Jaynie Collins  ANESTHESIA:  Epidural and local analgesia using 30 ml of 0.5% Marcaine  COMPLICATIONS:  None immediate.  ESTIMATED BLOOD LOSS: 10 ml.  FLUIDS: 1000 ml LR.  INDICATIONS:  42 y.o. [id number] with undesired fertility,status post vaginal delivery, desires permanent sterilization.  Other reversible forms of contraception were discussed with patient; she declines all other modalities. Risks of procedure discussed with patient including but not limited to: risk of regret, permanence of method, bleeding, infection, injury to surrounding organs and need for additional procedures.  Failure risk of 1 -2 % with increased risk of ectopic gestation if pregnancy occurs was also discussed with patient.      FINDINGS:  Normal uterus, tubes, and ovaries.  PROCEDURE DETAILS: The patient was taken to the operating room where her epidural anesthesia was dosed up to surgical level and found to be adequate.  She was then placed in the dorsal supine position and prepped and draped in sterile fashion.  After an adequate timeout was performed, attention was turned to the patient's abdomen where a small transverse skin incision was made under the umbilical fold. The incision was taken down to the layer of fascia using the scalpel, and fascia was incised, and extended bilaterally using Mayo scissors. The peritoneum was entered in a sharp fashion. Attention was then turned to the patient's uterus, and left fallopian tube was identified and followed out to the fimbriated end.  A Filshie clip was placed on the left fallopian tube about 3 cm from the cornual attachment, with care given to incorporate the underlying mesosalpinx.  A similar process was carried out on the  right side allowing for bilateral tubal sterilization.  Good hemostasis was noted overall.  Local analgesia was injected into both Filshie application sites.The instruments were then removed from the patient's abdomen and the fascial incision was repaired with 0 Vicryl, and the skin was closed with a 4-0 Vicryl subcuticular stitch. The patient tolerated the procedure well.  Instrument, sponge, and needle counts were correct times two.  The patient was then taken to the recovery room awake and in stable condition.  Jaynie Collins, MD, FACOG Attending Obstetrician & Gynecologist Faculty Practice, [facility]

## 2013-10-04 ENCOUNTER — Encounter (HOSPITAL_COMMUNITY): Payer: Self-pay | Admitting: Obstetrics & Gynecology

## 2013-10-04 ENCOUNTER — Other Ambulatory Visit: Payer: Medicaid Other

## 2013-10-04 LAB — CBC
Hemoglobin: 8.7 g/dL — ABNORMAL LOW (ref 12.0–15.0)
MCH: 30.6 pg (ref 26.0–34.0)
MCV: 91.2 fL (ref 78.0–100.0)
Platelets: 193 10*3/uL (ref 150–400)
RBC: 2.84 MIL/uL — ABNORMAL LOW (ref 3.87–5.11)
WBC: 7.7 10*3/uL (ref 4.0–10.5)

## 2013-10-04 MED ORDER — IBUPROFEN 600 MG PO TABS: 600.0000 mg | ORAL_TABLET | Freq: Four times a day (QID) | ORAL | Status: DC

## 2013-10-04 NOTE — Discharge Summary (Signed)
Attestation of Attending Supervision of Fellow: Evaluation and management procedures were performed by the Fellow under my supervision and collaboration.  I have reviewed the Fellow's note and chart, and I agree with the management and plan.    

## 2013-10-04 NOTE — Discharge Summary (Signed)
Obstetric Discharge Summary Reason for Admission: induction of labor Prenatal Procedures: none Intrapartum Procedures: spontaneous vaginal delivery and GBS prophylaxis Postpartum Procedures: P.P. tubal ligation Complications-Operative and Postpartum: none Hemoglobin  Date Value Range Status  10/02/2013 10.2* 12.0 - 15.0 g/dL Final     HCT  Date Value Range Status  10/02/2013 30.0* 36.0 - 46.0 % Final   Brief Hospital Course Pt is 42 yo G8 now [id number] s/p NSVD of healthy female after induction of labor for abnormal BPP on 10/02/13. Due to unfavorable cervix, induction was started with cytotec. After 2 cytotec, she was laboring on her own and went to completion and delivery the morning of 10/03/13. Pregnancy was complicated by chronic hypertension without need for medications. BP was within normal limits throughout admission. Pt also received intrapartum GBS prophylaxis with PCN. She had no intrapartum complications. On PPD#0, she underwent BTL and tolerated the procedure well. She has had no postpartum complications. On day of discharge, she reports ambulating ad lib, tolerating PO, urinating well, passing flatus. Her pain is controlled and her bleeding is less than menstrual flow. She is breast and bottle-feeding well.  Physical Exam:  Filed Vitals:   10/04/13 0535  BP: 127/82  Pulse: 81  Temp: 98.3 F (36.8 C)  Resp: 20    General: alert, cooperative and no distress Lochia: appropriate Uterine Fundus: firm Incision: healing well, no significant drainage, no significant erythema DVT Evaluation: No evidence of DVT seen on physical exam. Negative Homan's sign. No cords or calf tenderness. No significant calf/ankle edema.  Discharge Diagnoses: Term Pregnancy-delivered and PP BTL  Discharge Information: Date: 10/04/2013 Activity: pelvic rest Diet: routine Medications: PNV and Ibuprofen Condition: stable Instructions: refer to practice specific booklet Discharge to:  home Follow-up Information   Follow up with [facility] In 5 weeks. (Call as soon as possible to make appointment for postpartum visit in 4-6 weeks)    Contact information:   [address] [place] [address] [phone]      Newborn Data: Live born female  Birth Weight: 7 lb 4.9 oz (3315 g) APGAR: 8, 9  Home with mother.  Pior, Jearld Lesch 10/04/2013, 6:18 AM  I have seen and examined this patient and agree with above documentation in the resident's note.   Rulon Abide, M.D. [facility] Fellow 10/04/2013 11:25 AM

## 2013-10-04 NOTE — Lactation Note (Signed)
This note was copied from the chart of Tanya Fry. Lactation Consultation Note  Patient Name: Tanya Fry [id number] Date: 10/04/2013 Reason for consult: Follow-up assessment;Difficult latch Mom has not been able to get baby to latch. She has large breasts, large nipples and baby has small mouth. RN set up DEBP for Mom, she has pumped 1-2 times, not receiving colostrum yet. Demonstrated hand expression and how to stimulate nipple to be more erect. Demonstrated jaw massage to relax baby's mouth. Attempted to latch baby but she could not obtain a latch. Initiated #24 nipple shield and after few attempts baby latched and demonstrated a good rhythmic suck. Scant amount of colostrum in the nipple shield. Encouraged Mom to post pump every 3 hours on preemie setting to encourage milk production. Look for colostrum in the nipple shield at the end of the feeding. Advised to ask for assist with feedings.   Maternal Data    Feeding Feeding Type: Breast Fed  LATCH Score/Interventions Latch: Grasps breast easily, tongue down, lips flanged, rhythmical sucking. (after initiating #24 nipple shield) Intervention(s): Adjust position;Assist with latch;Breast massage;Breast compression  Audible Swallowing: None  Type of Nipple: Everted at rest and after stimulation (short nipple shaft) Intervention(s): Double electric pump  Comfort (Breast/Nipple): Soft / non-tender     Hold (Positioning): Assistance needed to correctly position infant at breast and maintain latch. Intervention(s): Breastfeeding basics reviewed;Support Pillows;Position options;Skin to skin  LATCH Score: 7  Lactation Tools Discussed/Used Tools: Nipple Dorris Carnes;Pump Nipple shield size: 24 Breast pump type: Double-Electric Breast Pump WIC Program: No Initiated by:: RN Date initiated:: 10/04/13   Consult Status Consult Status: Follow-up Date: 10/04/13 Follow-up type: In-patient    Alfred Levins 10/04/2013,  9:44 AM

## 2013-10-04 NOTE — Anesthesia Postprocedure Evaluation (Signed)
Anesthesia Post Note  Patient: Tanya Fry  Procedure(s) Performed: * No procedures listed *  Anesthesia type: Epidural  Patient location: Mother/Baby  Post pain: Pain level controlled  Post assessment: Post-op Vital signs reviewed  Last Vitals:  Filed Vitals:   10/04/13 0535  BP: 127/82  Pulse: 81  Temp: 36.8 C  Resp: 20    Post vital signs: Reviewed  Level of consciousness: awake  Complications: No apparent anesthesia complications

## 2013-10-05 ENCOUNTER — Ambulatory Visit: Payer: Self-pay

## 2013-10-05 NOTE — Lactation Note (Signed)
This note was copied from the chart of Tanya Lorece Keach. Lactation Consultation Note  Patient Name: Tanya Fry [id number] Date: 10/05/2013 Reason for consult: Follow-up assessment;Difficult latch  Visited with Mom on day of discharge, baby at 46 hrs old.  Baby had mostly bottles throughout night, and Mom pumped once using the DEBP at midnight.  Volume of supplement at 5 ml.  Instructed Mom to increase volume to guidelines given.  Assisted with latching baby onto left breast using a # 20 nipple shield (#24 NS was too big causing more pinching per RN), and baby stayed on and fed with nice jaw compression, no pain felt, but no swallowing heard (shield moist with droplets inside following feeding).  After about 20 mins., switched baby onto right side.  This nipple is slightly inverted, and entire nipple inverts when breast supported and compressed.  Encouraged pre-pumping (and wearing of shells) but all her equipment is packed in the car already.  Manual expression done, and #24 nipple shield placed and baby positioned and latched well.  Audible regular swallowing heard on the right side.   Offered pump rental, but Mom declined due to cost.  Does not have WIC yet.  Has manual breast pump, so instructed to pump 15 mins each breast after every other feeding at least.  Use her expressed breast pump for supplement as her milk comes in.  OP appt made for 10/09/13 @ 9am.  Encouraged to continue skin to skin and cue based feedings, and offering supplement per volume parameters until OP appt.  To call prn.  Consult Status Consult Status: Follow-up Date: 10/09/13 Follow-up type: Out-patient    Tanya Fry 10/05/2013, 9:48 AM

## 2013-10-09 ENCOUNTER — Telehealth: Payer: Self-pay | Admitting: General Practice

## 2013-10-09 ENCOUNTER — Ambulatory Visit (HOSPITAL_COMMUNITY)
Admission: RE | Admit: 2013-10-09 | Discharge: 2013-10-09 | Disposition: A | Discharge status: Home / Self Care | Payer: Medicaid Other | Source: Ambulatory Visit | Attending: Advanced Practice Midwife | Admitting: Advanced Practice Midwife

## 2013-10-09 NOTE — Lactation Note (Addendum)
Lactation Consultation Note  Patient Name: ANETHA SLAGEL [id number] Date: 10/09/2013  Mom did not bring baby w/her to Lactation appt.  Mom reports that baby is feeding very well w/"gulping" swallows.  Mom sees a lot of milk in nipple shield when baby releases latch.  Baby's stools are yellow & seedy & Mom reports excellent output. Mom does not desire to reschedule w/Lactation since nursing is going so well.    Mom knows she can call Peds office if she'd like to f/u w/baby's weight before baby's next f/u appt.   Lurline Hare [facility] 10/09/2013, 8:50 AM

## 2013-10-09 NOTE — Telephone Encounter (Signed)
Patient called and left message stating she had a baby on 12/23 and her stomach is hurting as well as having lower back pain and is also having a bad headache and a lot of swelling and would like a call back. Called patient back stating I was returning her phone call. Patient stated that she just doesn't feel well and has been cramping and back pain when she moves and a constant headache despite regularly taking ibuprofen and tylenol and is having a lot of swelling, probably 3+. Asked patient if she could come in tomorrow afternoon around 2 for a blood pressure check. Patient stated that she would and had no further questions

## 2013-10-10 ENCOUNTER — Inpatient Hospital Stay (HOSPITAL_COMMUNITY)
Admission: AD | Admit: 2013-10-10 | Discharge: 2013-10-10 | Disposition: A | Discharge status: Home / Self Care | Payer: Medicaid Other | Source: Ambulatory Visit | Attending: Obstetrics & Gynecology | Admitting: Obstetrics & Gynecology

## 2013-10-10 ENCOUNTER — Inpatient Hospital Stay (HOSPITAL_COMMUNITY): Admission: RE | Admit: 2013-10-10 | Payer: Medicaid Other | Source: Ambulatory Visit

## 2013-10-10 ENCOUNTER — Ambulatory Visit (INDEPENDENT_AMBULATORY_CARE_PROVIDER_SITE_OTHER): Payer: Medicaid Other | Admitting: General Practice

## 2013-10-10 ENCOUNTER — Encounter (HOSPITAL_COMMUNITY): Payer: Self-pay | Admitting: *Deleted

## 2013-10-10 VITALS — BP 170/100 | HR 69 | Temp 98.9°F | Ht 66.0 in | Wt 229.2 lb

## 2013-10-10 DIAGNOSIS — I10 Essential (primary) hypertension: Secondary | ICD-10-CM

## 2013-10-10 DIAGNOSIS — R51 Headache: Secondary | ICD-10-CM | POA: Insufficient documentation

## 2013-10-10 DIAGNOSIS — Z0130 Encounter for examination of blood pressure without abnormal findings: Secondary | ICD-10-CM

## 2013-10-10 DIAGNOSIS — Z136 Encounter for screening for cardiovascular disorders: Secondary | ICD-10-CM

## 2013-10-10 DIAGNOSIS — H538 Other visual disturbances: Secondary | ICD-10-CM | POA: Insufficient documentation

## 2013-10-10 DIAGNOSIS — O99891 Other specified diseases and conditions complicating pregnancy: Secondary | ICD-10-CM | POA: Insufficient documentation

## 2013-10-10 DIAGNOSIS — O10019 Pre-existing essential hypertension complicating pregnancy, unspecified trimester: Secondary | ICD-10-CM | POA: Insufficient documentation

## 2013-10-10 DIAGNOSIS — O10919 Unspecified pre-existing hypertension complicating pregnancy, unspecified trimester: Secondary | ICD-10-CM

## 2013-10-10 DIAGNOSIS — R0602 Shortness of breath: Secondary | ICD-10-CM | POA: Insufficient documentation

## 2013-10-10 DIAGNOSIS — O1003 Pre-existing essential hypertension complicating the puerperium: Secondary | ICD-10-CM

## 2013-10-10 LAB — COMPREHENSIVE METABOLIC PANEL
Albumin: 2.9 g/dL — ABNORMAL LOW (ref 3.5–5.2)
Alkaline Phosphatase: 88 U/L (ref 39–117)
BUN: 15 mg/dL (ref 6–23)
Calcium: 8.9 mg/dL (ref 8.4–10.5)
Chloride: 104 mEq/L (ref 96–112)
Creatinine, Ser: 0.85 mg/dL (ref 0.50–1.10)
GFR calc Af Amer: >90 mL/min (ref >90)
GFR calc non Af Amer: 82 mL/min — ABNORMAL LOW (ref >90)
Glucose, Bld: 82 mg/dL (ref 70–99)
Potassium: 4.1 mEq/L (ref 3.7–5.3)
Sodium: 142 mEq/L (ref 137–147)
Total Bilirubin: <0.2 mg/dL — ABNORMAL LOW (ref 0.3–1.2)

## 2013-10-10 LAB — PROTEIN / CREATININE RATIO, URINE
Creatinine, Urine: 96.14 mg/dL
Protein Creatinine Ratio: 0.7 — ABNORMAL HIGH (ref 0.00–0.15)
Total Protein, Urine: 67.3 mg/dL

## 2013-10-10 LAB — CBC
HCT: 29.7 % — ABNORMAL LOW (ref 36.0–46.0)
Hemoglobin: 9.9 g/dL — ABNORMAL LOW (ref 12.0–15.0)
MCV: 90.5 fL (ref 78.0–100.0)
RDW: 13.3 % (ref 11.5–15.5)
WBC: 8.9 10*3/uL (ref 4.0–10.5)

## 2013-10-10 MED ORDER — OXYCODONE-ACETAMINOPHEN 5-325 MG PO TABS: 1.0000 | ORAL_TABLET | ORAL | Status: DC | PRN

## 2013-10-10 MED ORDER — METOPROLOL TARTRATE 25 MG PO TABS: 25.0000 mg | ORAL_TABLET | Freq: Two times a day (BID) | ORAL | Status: DC

## 2013-10-10 MED ORDER — ASPIRIN 81 MG PO CHEW
81.0000 mg | CHEWABLE_TABLET | Freq: Once | ORAL | Status: AC
  Administered 2013-10-10: 81 mg via ORAL
  Filled 2013-10-10: qty 1

## 2013-10-10 MED ORDER — ASPIRIN 81 MG PO CHEW: 81.0000 mg | CHEWABLE_TABLET | Freq: Every day | ORAL | Status: DC

## 2013-10-10 MED ORDER — HYDROCHLOROTHIAZIDE 25 MG PO TABS: 25.0000 mg | ORAL_TABLET | Freq: Every day | ORAL | Status: DC

## 2013-10-10 MED ORDER — OXYCODONE-ACETAMINOPHEN 5-325 MG PO TABS
2.0000 | ORAL_TABLET | Freq: Once | ORAL | Status: AC
  Administered 2013-10-10: 2 via ORAL
  Filled 2013-10-10: qty 2

## 2013-10-10 MED ORDER — METOPROLOL TARTRATE 25 MG PO TABS
25.0000 mg | ORAL_TABLET | Freq: Once | ORAL | Status: AC
  Administered 2013-10-10: 25 mg via ORAL
  Filled 2013-10-10: qty 1

## 2013-10-10 NOTE — MAU Provider Note (Signed)
History     CSN: [id number]  Arrival date and time: 10/10/13 1421   First Provider Initiated Contact with Patient 10/10/13 1503      Chief Complaint  Patient presents with  . Hypertension   HPI  Tanya Fry is a 42 y.o. [id number] who is s/p NSVD with tubal ligation on 10/03/13 who presents today with a 7/10 headache x3 days. She has been taking tylenol and ibuprofen, but it is not helping. She is also having swelling, blurry vision and shortness of breath. She has a hx of cardiomegaly after her second delivery, but did not have issues with her other deliveries.   She states that she saw the cardiologist in November and reports that "everything was normal", and she did not need to return unless problems developed. She has a hx of CHTN and she was not on any medications during her pregnancy. She has been taking HCTZ 25mg  since discharge from the hospital (10/04/13). She had some leftover at home, and took it because she was having swelling. She has had one dose today.   Past Medical History  Diagnosis Date  . Hypertension     CHTN, no current meds  . Anemia   . Postpartum cardiomyopathy     Past Surgical History  Procedure Laterality Date  . No past surgeries    . Cervical cerclage    . Tubal ligation Bilateral 10/03/2013    Procedure: POST PARTUM TUBAL LIGATION;  Surgeon: Tereso Newcomer, MD;  Location: [facility];  Service: Gynecology;  Laterality: Bilateral;    Family History  Problem Relation Age of Onset  . Cancer Maternal Grandmother     breast cancer  . Cancer Paternal Grandfather     prostate    History  Substance Use Topics  . Smoking status: Former Smoker -- 0.50 packs/day    Quit date: 12/28/2012  . Smokeless tobacco: Never Used  . Alcohol Use: Yes    Allergies:  Allergies  Allergen Reactions  . Shellfish Allergy Anaphylaxis    Prescriptions prior to admission  Medication Sig Dispense Refill  . acetaminophen (TYLENOL) 500 MG tablet Take 1,000 mg  by mouth every 6 (six) hours as needed for mild pain.       . hydrochlorothiazide (HYDRODIURIL) 25 MG tablet Take 25 mg by mouth daily.      Marland Kitchen ibuprofen (ADVIL,MOTRIN) 600 MG tablet Take 1 tablet (600 mg total) by mouth every 6 (six) hours.  30 tablet  0  . Prenatal Vit-Fe Fumarate-FA (PRENATAL MULTIVITAMIN) TABS tablet Take 1 tablet by mouth at bedtime.        ROS Physical Exam   Blood pressure 171/91, pulse 63, temperature 99.1 F (37.3 C), temperature source Oral, resp. rate 18, last menstrual period 01/03/2013, SpO2 98.00%, currently breastfeeding.  Physical Exam  Nursing note and vitals reviewed. Constitutional: She is oriented to person, place, and time. She appears well-developed and well-nourished. No distress.  Cardiovascular: Normal rate and normal heart sounds.   Respiratory: Effort normal and breath sounds normal. No respiratory distress. She has no wheezes. She has no rales.  GI: Soft. There is no tenderness.  Musculoskeletal: She exhibits edema (1+ to BLE).  Neurological: She is alert and oriented to person, place, and time. She has normal reflexes.  No clonus   Skin: Skin is warm and dry.  Psychiatric: She has a normal mood and affect.    MAU Course  Procedures  Results for orders placed during the hospital encounter of 10/10/13 (  from the past 24 hour(s))  CBC     Status: Abnormal   Collection Time    10/10/13  2:27 PM      Result Value Range   WBC 8.9  4.0 - 10.5 K/uL   RBC 3.28 (*) 3.87 - 5.11 MIL/uL   Hemoglobin 9.9 (*) 12.0 - 15.0 g/dL   HCT 16.1 (*) 09.6 - 04.5 %   MCV 90.5  78.0 - 100.0 fL   MCH 30.2  26.0 - 34.0 pg   MCHC 33.3  30.0 - 36.0 g/dL   RDW 40.9  81.1 - 91.4 %   Platelets 312  150 - 400 K/uL  COMPREHENSIVE METABOLIC PANEL     Status: Abnormal   Collection Time    10/10/13  2:27 PM      Result Value Range   Sodium 142  137 - 147 mEq/L   Potassium 4.1  3.7 - 5.3 mEq/L   Chloride 104  96 - 112 mEq/L   CO2 27  19 - 32 mEq/L   Glucose, Bld  82  70 - 99 mg/dL   BUN 15  6 - 23 mg/dL   Creatinine, Ser 7.82  0.50 - 1.10 mg/dL   Calcium 8.9  8.4 - 95.6 mg/dL   Total Protein 6.7  6.0 - 8.3 g/dL   Albumin 2.9 (*) 3.5 - 5.2 g/dL   AST 13  0 - 37 U/L   ALT 17  0 - 35 U/L   Alkaline Phosphatase 88  39 - 117 U/L   Total Bilirubin <0.2 (*) 0.3 - 1.2 mg/dL   GFR calc non Af Amer 82 (*) >90 mL/min   GFR calc Af Amer >90  >90 mL/min  PROTEIN / CREATININE RATIO, URINE     Status: Abnormal   Collection Time    10/10/13  2:48 PM      Result Value Range   Creatinine, Urine 96.14     Total Protein, Urine 67.3     PROTEIN CREATININE RATIO 0.70 (*) 0.00 - 0.15   1626: Dr. Adrian Blackwater notified, will see the patient and do admit orders.  1630: Dr. Adrian Blackwater called back. He spoke with Dr. Debroah Loop. Will send home with metoprolo 25mg  BID and baby asa daily. FU in clinic Friday for BP check.  Assessment and Plan   1. Maternal chronic hypertension, unspecified trimester      Medication List         acetaminophen 500 MG tablet  Commonly known as:  TYLENOL  Take 1,000 mg by mouth every 6 (six) hours as needed for mild pain.     aspirin 81 MG chewable tablet  Commonly known as:  ASPIRIN CHILDRENS  Chew 1 tablet (81 mg total) by mouth daily.     hydrochlorothiazide 25 MG tablet  Commonly known as:  HYDRODIURIL  Take 25 mg by mouth daily.     ibuprofen 600 MG tablet  Commonly known as:  ADVIL,MOTRIN  Take 1 tablet (600 mg total) by mouth every 6 (six) hours.     metoprolol tartrate 25 MG tablet  Commonly known as:  LOPRESSOR  Take 1 tablet (25 mg total) by mouth 2 (two) times daily.     prenatal multivitamin Tabs tablet  Take 1 tablet by mouth at bedtime.       Follow-up Information   Follow up with [facility] (call for appointment for Friday for B/P check )    Specialty:  Obstetrics and Gynecology   Contact  information:   [address] [place] [address] [phone]       Tawnya Crook 10/10/2013, 3:04 PM

## 2013-10-10 NOTE — Progress Notes (Signed)
Patient reports back & head started to hurt on Saturday and hasn't had relief despite using Tylenol & Ibuprofen. Reports that difficulty breathing and shortness of breath starting last night and cannot breathe if she lays down. Currently has blurry vision and states this feels like when she had cardiomyopathy with her G2 delivery. Spoke with Dr Ike Bene about patient and current symptoms, he advised patient go to the ER given her symptoms and history. Advised patient to go to ER for assessment. Patient verbalized understanding and was insistent upon going to [facility] for care rather than [facility] or [facility]

## 2013-10-10 NOTE — Progress Notes (Signed)
Tanya Fry CNM in to discuss lab results and d/c plan. Written and verbal d/c instructions given and understanding voiced.

## 2013-10-10 NOTE — MAU Note (Addendum)
Hx of CHTN, was not on meds during preg.  Delivered 12/23.  Has had HA for past 3 days,  Went to clinic for BP check, BP elevated, sent up for further eval. Started feeling short of breath last night during the night.  Hurts when she takes a deep breath. Hx of cardiomyopathy with 2nd child

## 2013-10-14 ENCOUNTER — Emergency Department (HOSPITAL_COMMUNITY): Payer: Medicaid Other

## 2013-10-14 ENCOUNTER — Inpatient Hospital Stay (HOSPITAL_COMMUNITY)
Admission: EM | Admit: 2013-10-14 | Discharge: 2013-10-17 | DRG: 776 | Disposition: A | Discharge status: Home / Self Care | Payer: Medicaid Other | Attending: Internal Medicine | Admitting: Internal Medicine

## 2013-10-14 ENCOUNTER — Encounter (HOSPITAL_COMMUNITY): Payer: Self-pay | Admitting: Emergency Medicine

## 2013-10-14 DIAGNOSIS — I2510 Atherosclerotic heart disease of native coronary artery without angina pectoris: Principal | ICD-10-CM | POA: Diagnosis present

## 2013-10-14 DIAGNOSIS — Z3490 Encounter for supervision of normal pregnancy, unspecified, unspecified trimester: Secondary | ICD-10-CM

## 2013-10-14 DIAGNOSIS — F1390 Sedative, hypnotic, or anxiolytic use, unspecified, uncomplicated: Secondary | ICD-10-CM

## 2013-10-14 DIAGNOSIS — E876 Hypokalemia: Secondary | ICD-10-CM

## 2013-10-14 DIAGNOSIS — R0989 Other specified symptoms and signs involving the circulatory and respiratory systems: Secondary | ICD-10-CM

## 2013-10-14 DIAGNOSIS — Z79899 Other long term (current) drug therapy: Secondary | ICD-10-CM

## 2013-10-14 DIAGNOSIS — I5033 Acute on chronic diastolic (congestive) heart failure: Secondary | ICD-10-CM | POA: Diagnosis present

## 2013-10-14 DIAGNOSIS — O09213 Supervision of pregnancy with history of pre-term labor, third trimester: Secondary | ICD-10-CM

## 2013-10-14 DIAGNOSIS — O903 Peripartum cardiomyopathy: Secondary | ICD-10-CM | POA: Diagnosis present

## 2013-10-14 DIAGNOSIS — O165 Unspecified maternal hypertension, complicating the puerperium: Secondary | ICD-10-CM | POA: Diagnosis present

## 2013-10-14 DIAGNOSIS — O98213 Gonorrhea complicating pregnancy, third trimester: Secondary | ICD-10-CM

## 2013-10-14 DIAGNOSIS — I10 Essential (primary) hypertension: Secondary | ICD-10-CM | POA: Diagnosis present

## 2013-10-14 DIAGNOSIS — I509 Heart failure, unspecified: Secondary | ICD-10-CM | POA: Diagnosis present

## 2013-10-14 DIAGNOSIS — R0609 Other forms of dyspnea: Secondary | ICD-10-CM

## 2013-10-14 DIAGNOSIS — O9943 Diseases of the circulatory system complicating the puerperium: Principal | ICD-10-CM

## 2013-10-14 DIAGNOSIS — Z87891 Personal history of nicotine dependence: Secondary | ICD-10-CM

## 2013-10-14 DIAGNOSIS — O99893 Other specified diseases and conditions complicating puerperium: Secondary | ICD-10-CM | POA: Diagnosis present

## 2013-10-14 DIAGNOSIS — O283 Abnormal ultrasonic finding on antenatal screening of mother: Secondary | ICD-10-CM

## 2013-10-14 DIAGNOSIS — Z803 Family history of malignant neoplasm of breast: Secondary | ICD-10-CM

## 2013-10-14 DIAGNOSIS — F1310 Sedative, hypnotic or anxiolytic abuse, uncomplicated: Secondary | ICD-10-CM

## 2013-10-14 DIAGNOSIS — O9989 Other specified diseases and conditions complicating pregnancy, childbirth and the puerperium: Secondary | ICD-10-CM

## 2013-10-14 DIAGNOSIS — Z302 Encounter for sterilization: Secondary | ICD-10-CM

## 2013-10-14 LAB — CBC WITH DIFFERENTIAL/PLATELET
BASOS ABS: 0 10*3/uL (ref 0.0–0.1)
Basophils Relative: 0 % (ref 0–1)
Eosinophils Absolute: 0.3 10*3/uL (ref 0.0–0.7)
Eosinophils Relative: 4 % (ref 0–5)
HCT: 30.9 % — ABNORMAL LOW (ref 36.0–46.0)
HEMOGLOBIN: 10.2 g/dL — AB (ref 12.0–15.0)
LYMPHS ABS: 1.5 10*3/uL (ref 0.7–4.0)
LYMPHS PCT: 25 % (ref 12–46)
MCH: 30 pg (ref 26.0–34.0)
MCHC: 33 g/dL (ref 30.0–36.0)
MCV: 90.9 fL (ref 78.0–100.0)
MONO ABS: 0.4 10*3/uL (ref 0.1–1.0)
MONOS PCT: 6 % (ref 3–12)
NEUTROS ABS: 4 10*3/uL (ref 1.7–7.7)
Neutrophils Relative %: 65 % (ref 43–77)
Platelets: 340 10*3/uL (ref 150–400)
RBC: 3.4 MIL/uL — AB (ref 3.87–5.11)
RDW: 13.2 % (ref 11.5–15.5)
WBC: 6.2 10*3/uL (ref 4.0–10.5)

## 2013-10-14 LAB — URINALYSIS, ROUTINE W REFLEX MICROSCOPIC
Bilirubin Urine: NEGATIVE
Glucose, UA: NEGATIVE mg/dL
Ketones, ur: NEGATIVE mg/dL
Nitrite: NEGATIVE
PROTEIN: 30 mg/dL — AB
SPECIFIC GRAVITY, URINE: 1.021 (ref 1.005–1.030)
UROBILINOGEN UA: 0.2 mg/dL (ref 0.0–1.0)
pH: 6.5 (ref 5.0–8.0)

## 2013-10-14 LAB — COMPREHENSIVE METABOLIC PANEL
ALT: 17 U/L (ref 0–35)
AST: 12 U/L (ref 0–37)
Albumin: 2.8 g/dL — ABNORMAL LOW (ref 3.5–5.2)
Alkaline Phosphatase: 87 U/L (ref 39–117)
BUN: 19 mg/dL (ref 6–23)
CHLORIDE: 104 meq/L (ref 96–112)
CO2: 25 meq/L (ref 19–32)
CREATININE: 0.82 mg/dL (ref 0.50–1.10)
Calcium: 8.3 mg/dL — ABNORMAL LOW (ref 8.4–10.5)
GFR, EST NON AFRICAN AMERICAN: 86 mL/min — AB (ref >90)
GLUCOSE: 84 mg/dL (ref 70–99)
Potassium: 3.6 mEq/L — ABNORMAL LOW (ref 3.7–5.3)
Sodium: 141 mEq/L (ref 137–147)
Total Bilirubin: <0.2 mg/dL — ABNORMAL LOW (ref 0.3–1.2)
Total Protein: 6.5 g/dL (ref 6.0–8.3)

## 2013-10-14 LAB — PRO B NATRIURETIC PEPTIDE: PRO B NATRI PEPTIDE: 2377 pg/mL — AB (ref 0–125)

## 2013-10-14 LAB — TROPONIN I: Troponin I: <0.3 ng/mL (ref <0.30)

## 2013-10-14 LAB — URINE MICROSCOPIC-ADD ON

## 2013-10-14 MED ORDER — IBUPROFEN 800 MG PO TABS
400.0000 mg | ORAL_TABLET | Freq: Three times a day (TID) | ORAL | Status: DC | PRN
  Administered 2013-10-14 – 2013-10-15 (×3): 400 mg via ORAL
  Administered 2013-10-16: 04:00:00 via ORAL
  Administered 2013-10-16 – 2013-10-17 (×2): 400 mg via ORAL
  Filled 2013-10-14: qty 0.5
  Filled 2013-10-14: qty 1
  Filled 2013-10-14: qty 0.5
  Filled 2013-10-14 (×2): qty 1
  Filled 2013-10-14: qty 0.5

## 2013-10-14 MED ORDER — FUROSEMIDE 10 MG/ML IJ SOLN
40.0000 mg | Freq: Once | INTRAMUSCULAR | Status: AC
  Administered 2013-10-14: 40 mg via INTRAVENOUS
  Filled 2013-10-14: qty 4

## 2013-10-14 MED ORDER — ZOLPIDEM TARTRATE 5 MG PO TABS
5.0000 mg | ORAL_TABLET | Freq: Once | ORAL | Status: AC
  Administered 2013-10-14: 5 mg via ORAL
  Filled 2013-10-14: qty 1

## 2013-10-14 MED ORDER — ENOXAPARIN SODIUM 40 MG/0.4ML ~~LOC~~ SOLN
40.0000 mg | SUBCUTANEOUS | Status: DC
  Administered 2013-10-14 – 2013-10-16 (×3): 40 mg via SUBCUTANEOUS
  Filled 2013-10-14 (×4): qty 0.4

## 2013-10-14 MED ORDER — ACETAMINOPHEN 325 MG PO TABS
650.0000 mg | ORAL_TABLET | ORAL | Status: DC | PRN
  Administered 2013-10-14: 650 mg via ORAL
  Filled 2013-10-14: qty 2

## 2013-10-14 MED ORDER — SODIUM CHLORIDE 0.9 % IV SOLN: 250.0000 mL | INTRAVENOUS | Status: DC | PRN

## 2013-10-14 MED ORDER — SODIUM CHLORIDE 0.9 % IJ SOLN: 3.0000 mL | INTRAMUSCULAR | Status: DC | PRN

## 2013-10-14 MED ORDER — LABETALOL HCL 100 MG PO TABS
100.0000 mg | ORAL_TABLET | Freq: Two times a day (BID) | ORAL | Status: DC
  Administered 2013-10-14 – 2013-10-16 (×5): 100 mg via ORAL
  Filled 2013-10-14 (×6): qty 1

## 2013-10-14 MED ORDER — FUROSEMIDE 10 MG/ML IJ SOLN
20.0000 mg | Freq: Two times a day (BID) | INTRAMUSCULAR | Status: DC
  Administered 2013-10-14 – 2013-10-16 (×5): 20 mg via INTRAVENOUS
  Filled 2013-10-14 (×9): qty 2

## 2013-10-14 MED ORDER — SODIUM CHLORIDE 0.9 % IJ SOLN
3.0000 mL | Freq: Two times a day (BID) | INTRAMUSCULAR | Status: DC
  Administered 2013-10-14 – 2013-10-16 (×5): 3 mL via INTRAVENOUS
  Administered 2013-10-16: 10:00:00 via INTRAVENOUS

## 2013-10-14 MED ORDER — HYDROCHLOROTHIAZIDE 25 MG PO TABS
25.0000 mg | ORAL_TABLET | Freq: Every day | ORAL | Status: DC
  Administered 2013-10-14 – 2013-10-16 (×3): 25 mg via ORAL
  Filled 2013-10-14 (×4): qty 1

## 2013-10-14 MED ORDER — ASPIRIN 81 MG PO CHEW
81.0000 mg | CHEWABLE_TABLET | Freq: Every day | ORAL | Status: DC
  Administered 2013-10-14 – 2013-10-17 (×4): 81 mg via ORAL
  Filled 2013-10-14 (×5): qty 1

## 2013-10-14 MED ORDER — ACETAMINOPHEN 500 MG PO TABS
1000.0000 mg | ORAL_TABLET | Freq: Once | ORAL | Status: AC
  Administered 2013-10-14: 1000 mg via ORAL
  Filled 2013-10-14: qty 2

## 2013-10-14 MED ORDER — ONDANSETRON HCL 4 MG/2ML IJ SOLN: 4.0000 mg | Freq: Four times a day (QID) | INTRAMUSCULAR | Status: DC | PRN

## 2013-10-14 NOTE — Consult Note (Signed)
CARDIOLOGY CONSULT NOTE  Patient ID: Tanya LouisLisa Y Ellinger MRN: [id number] DOB/AGE: 45/03/1971 45 y.o.  Admit date: 10/14/2013 Primary Physician Dorrene GermanAVBUERE,EDWIN A, MD Primary Cardiologist Dr. 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  She last saw Dr. Allyson SabalBerry in the office in November when she was [redacted] weeks pregnant.    EF at that time was normal.  She had normal delivery on 12/23.  She presented today with increased leg swelling, headache and SOB. She has had problems recently with poor BP control.  BP on presentation was 171/101.   ProBNP is somewhat elevated at 2377.    She was treated with IV Lasix.    She reports that on Friday she had some increased dyspnea and [facility]  Her BP was up.  She was managed and released.  She had increased dyspnea last night with some orthopnea.  She says that her lower extremity swelling was actually less than previous.  She thought she might have failure as this was like her previous presentation so she took some HCTZ that she had at home.  The patient denies any new symptoms such as chest discomfort, neck or arm discomfort. There have been no reported palpitations, presyncope or syncope.   Past Medical History  Diagnosis Date  . Hypertension     CHTN, no current meds  . Anemia   . Postpartum cardiomyopathy     Past Surgical History  Procedure Laterality Date  . No past surgeries    . Cervical cerclage    . Tubal ligation Bilateral 10/03/2013    Procedure: POST PARTUM TUBAL LIGATION;  Surgeon: Tereso NewcomerUgonna A Anyanwu, MD;  Location: [facility];  Service: Gynecology;  Laterality: Bilateral;    Allergies  Allergen Reactions  . Shellfish Allergy Anaphylaxis   Prescriptions prior to admission  Medication Sig Dispense Refill  . acetaminophen (TYLENOL) 500 MG tablet Take 1,000 mg by mouth every 6 (six) hours as needed for mild pain.       Marland Kitchen. aspirin (ASPIRIN CHILDRENS) 81 MG chewable tablet Chew 1 tablet  (81 mg total) by mouth daily.  30 tablet  0  . hydrochlorothiazide (HYDRODIURIL) 25 MG tablet Take 1 tablet (25 mg total) by mouth daily.  30 tablet  0  . ibuprofen (ADVIL,MOTRIN) 600 MG tablet Take 1 tablet (600 mg total) by mouth every 6 (six) hours.  30 tablet  0  . metoprolol tartrate (LOPRESSOR) 25 MG tablet Take 1 tablet (25 mg total) by mouth 2 (two) times daily.  60 tablet  0  . oxyCODONE-acetaminophen (ROXICET) 5-325 MG per tablet Take 1-2 tablets by mouth every 4 (four) hours as needed for severe pain.  15 tablet  0   Family History  Problem Relation Age of Onset  . Cancer Maternal Grandmother     breast cancer  . Cancer Paternal Grandfather     prostate    History   Social History  . Marital Status: Single    Spouse Name: N/A    Number of Children: N/A  . Years of Education: N/A   Occupational History  . Not on file.   Social History Main Topics  . Smoking status: Former Smoker -- 0.50 packs/day    Quit date: 12/28/2012  . Smokeless tobacco: Never Used  . Alcohol Use: Yes  . Drug Use: No  . Sexual Activity: Not Currently   Other Topics Concern  . Not on file  Social History Narrative  . No narrative on file     ROS:    As stated in the HPI and negative for all other systems.  Physical Exam: Blood pressure 180/100, pulse 65, temperature 97.7 F (36.5 C), temperature source Oral, resp. rate 18, height 5' 6.5" (1.689 m), weight 224 lb 3.3 oz (101.7 kg), last menstrual period 01/03/2013, SpO2 97.00%.  GENERAL:  Well appearing HEENT:  Pupils equal round and reactive, fundi not visualized, oral mucosa unremarkable NECK:  No jugular venous distention, waveform within normal limits, carotid upstroke brisk and symmetric, no bruits, no thyromegaly LYMPHATICS:  No cervical, inguinal adenopathy LUNGS:  Clear to auscultation bilaterally BACK:  No CVA tenderness CHEST:  Unremarkable HEART:  PMI not displaced or sustained,S1 and S2 within normal limits, no S3, no S4,  no clicks, no rubs, no murmurs ABD:  Flat, positive bowel sounds normal in frequency in pitch, no bruits, no rebound, no guarding, no midline pulsatile mass, no hepatomegaly, no splenomegaly EXT:  2 plus pulses throughout, no edema, no cyanosis no clubbing SKIN:  No rashes no nodules NEURO:  Cranial nerves II through XII grossly intact, motor grossly intact throughout PSYCH:  Cognitively intact, oriented to person place and time   Labs: Lab Results  Component Value Date   BUN 19 10/14/2013   Lab Results  Component Value Date   CREATININE 0.82 10/14/2013   Lab Results  Component Value Date   NA 141 10/14/2013   K 3.6* 10/14/2013   CL 104 10/14/2013   CO2 25 10/14/2013   Lab Results  Component Value Date   TROPONINI <0.30 10/14/2013   Lab Results  Component Value Date   WBC 6.2 10/14/2013   HGB 10.2* 10/14/2013   HCT 30.9* 10/14/2013   MCV 90.9 10/14/2013   PLT 340 10/14/2013   No results found for this basename: CHOL, HDL, LDLCALC, LDLDIRECT, TRIG, CHOLHDL   Lab Results  Component Value Date   ALT 17 10/14/2013   AST 12 10/14/2013   ALKPHOS 87 10/14/2013   BILITOT <0.2* 10/14/2013      Radiology:   CXR: . Central airway thickening/peribronchial cuffing, mild  interstitial prominence and linear atelectasis in the right middle  lobe. While nonspecific, findings are most suggestive of atypical or  viral pneumonia. Query clinical history of influenza?  2. Perhaps mild interval enlargement of the cardiopericardial  silhouette. While this may be related to differences in technique,  small pericardial effusion or interval cardiac enlargement is not  excluded entirely.   EKG:  Sinus rhythm, rate 64, axis within normal limits, intervals within normal limits, no acute ST-T wave changes.  ASSESSMENT AND PLAN:   HTN:  I will start labetalol.  Avoid ACE as this is not as well studied for breast feeding mothers.  She could also be on HCTZ with this.    DYSPNEA:  Repeat echo.  Further medical  management will be pending these results.  Of note she had a tubal ligation so repeat pregnancy is no longer an issue.  She will be educated about salt and fluid restriction.   SignedRollene Rotunda 10/14/2013, 12:26 PM

## 2013-10-14 NOTE — ED Provider Notes (Signed)
CSN: [id number]     Arrival date & time 10/14/13  4098 History   First MD Initiated Contact with Patient 10/14/13 0732     Chief Complaint  Patient presents with  . Shortness of Breath  . Headache   (Consider location/radiation/quality/duration/timing/severity/associated sxs/prior Treatment) HPI Comments: Patient presents to the emergency department for evaluation of headache and shortness of breath. Patient had a normal spontaneous vaginal delivery of her 4th child on December 23. Since then she has noticed increased swelling of her legs, headache, shortness of breath. Patient reports a history of cardiomyopathy with her second child. She reports no problems with her third child. She also reports that she had no problems throughout this pregnancy. She reports her blood pressure was 120/80 throughout the entire pregnancy up until delivery. When she started to notice a headache and swelling of her legs, she started taking hydrochlorothiazide which he had left over from a previous prescription.  She was seen at the [facility] on December 30 for these problems. At that time blood pressure was 180s over 100s. She was initiated on Lopressor and discharged. Patient reports persistent headaches. It is diffuse, global, pressure across her face and top of her head. She has noticed increasing shortness of breath when she lays down at night. She also reports significantly decreased ability to walk distances, even compared to what she felt when she was pregnant.  Patient is a 42 y.o. female presenting with shortness of breath and headaches.  Shortness of Breath Associated symptoms: headaches   Headache Associated symptoms: congestion     Past Medical History  Diagnosis Date  . Hypertension     CHTN, no current meds  . Anemia   . Postpartum cardiomyopathy    Past Surgical History  Procedure Laterality Date  . No past surgeries    . Cervical cerclage    . Tubal ligation Bilateral 10/03/2013    Procedure:  POST PARTUM TUBAL LIGATION;  Surgeon: Tereso Newcomer, MD;  Location: [facility];  Service: Gynecology;  Laterality: Bilateral;   Family History  Problem Relation Age of Onset  . Cancer Maternal Grandmother     breast cancer  . Cancer Paternal Grandfather     prostate   History  Substance Use Topics  . Smoking status: Former Smoker -- 0.50 packs/day    Quit date: 12/28/2012  . Smokeless tobacco: Never Used  . Alcohol Use: Yes   OB History   Grav Para Term Preterm Abortions TAB SAB Ect Mult Living   8 4 3 1 4  4   4      Review of Systems  HENT: Positive for congestion.   Respiratory: Positive for shortness of breath.   Cardiovascular: Positive for leg swelling.  Neurological: Positive for headaches.  All other systems reviewed and are negative.    Allergies  Shellfish allergy  Home Medications   Current Outpatient Rx  Name  Route  Sig  Dispense  Refill  . acetaminophen (TYLENOL) 500 MG tablet   Oral   Take 1,000 mg by mouth every 6 (six) hours as needed for mild pain.          Marland Kitchen aspirin (ASPIRIN CHILDRENS) 81 MG chewable tablet   Oral   Chew 1 tablet (81 mg total) by mouth daily.   30 tablet   0   . hydrochlorothiazide (HYDRODIURIL) 25 MG tablet   Oral   Take 1 tablet (25 mg total) by mouth daily.   30 tablet   0   .  ibuprofen (ADVIL,MOTRIN) 600 MG tablet   Oral   Take 1 tablet (600 mg total) by mouth every 6 (six) hours.   30 tablet   0   . metoprolol tartrate (LOPRESSOR) 25 MG tablet   Oral   Take 1 tablet (25 mg total) by mouth 2 (two) times daily.   60 tablet   0   . oxyCODONE-acetaminophen (ROXICET) 5-325 MG per tablet   Oral   Take 1-2 tablets by mouth every 4 (four) hours as needed for severe pain.   15 tablet   0    BP 171/101  Pulse 67  Temp(Src) 98.3 F (36.8 C) (Oral)  SpO2 97%  LMP 01/03/2013 Physical Exam  Constitutional: She is oriented to person, place, and time. She appears well-developed and well-nourished. No distress.   HENT:  Head: Normocephalic and atraumatic.  Right Ear: Hearing normal.  Left Ear: Hearing normal.  Nose: Nose normal.  Mouth/Throat: Oropharynx is clear and moist and mucous membranes are normal.  Eyes: Conjunctivae and EOM are normal. Pupils are equal, round, and reactive to light.  Neck: Normal range of motion. Neck supple.  Cardiovascular: Regular rhythm, S1 normal and S2 normal.  Exam reveals no gallop and no friction rub.   No murmur heard. Pulmonary/Chest: Effort normal and breath sounds normal. No respiratory distress. She exhibits no tenderness.  Abdominal: Soft. Normal appearance and bowel sounds are normal. There is no hepatosplenomegaly. There is no tenderness. There is no rebound, no guarding, no tenderness at McBurney's point and negative Murphy's sign. No hernia.  Musculoskeletal: Normal range of motion.  Neurological: She is alert and oriented to person, place, and time. She has normal strength and normal reflexes. No cranial nerve deficit or sensory deficit. Coordination normal. GCS eye subscore is 4. GCS verbal subscore is 5. GCS motor subscore is 6.  Skin: Skin is warm, dry and intact. No rash noted. No cyanosis.  Psychiatric: She has a normal mood and affect. Her speech is normal and behavior is normal. Thought content normal.    ED Course  Procedures (including critical care time) Labs Review Labs Reviewed  CBC WITH DIFFERENTIAL - Abnormal; Notable for the following:    RBC 3.40 (*)    Hemoglobin 10.2 (*)    HCT 30.9 (*)    All other components within normal limits  COMPREHENSIVE METABOLIC PANEL - Abnormal; Notable for the following:    Potassium 3.6 (*)    Calcium 8.3 (*)    Albumin 2.8 (*)    Total Bilirubin <0.2 (*)    GFR calc non Af Amer 86 (*)    All other components within normal limits  PRO B NATRIURETIC PEPTIDE - Abnormal; Notable for the following:    Pro B Natriuretic peptide (BNP) 2377.0 (*)    All other components within normal limits   URINALYSIS, ROUTINE W REFLEX MICROSCOPIC - Abnormal; Notable for the following:    APPearance CLOUDY (*)    Hgb urine dipstick SMALL (*)    Protein, ur 30 (*)    Leukocytes, UA SMALL (*)    All other components within normal limits  TROPONIN I  URINE MICROSCOPIC-ADD ON   Imaging Review Dg Chest 2 View  10/14/2013   CLINICAL DATA:  Cough, congestion, short of breath for 5 days. Recently postpartum.  EXAM: CHEST  2 VIEW  COMPARISON:  Prior chest x-ray 01/05/2013  FINDINGS: Diffuse peribronchial cuffing and mild interstitial prominence with linear atelectasis in the right middle lobe. No discrete focal airspace  consolidation. No pleural effusion or pneumothorax. Query mild interval enlargement of the cardiopericardial silhouette. No acute osseous abnormality.  IMPRESSION: 1. Central airway thickening/peribronchial cuffing, mild interstitial prominence and linear atelectasis in the right middle lobe. While nonspecific, findings are most suggestive of atypical or viral pneumonia. Query clinical history of influenza? 2. Perhaps mild interval enlargement of the cardiopericardial silhouette. While this may be related to differences in technique, small pericardial effusion or interval cardiac enlargement is not excluded entirely.   Electronically Signed   By: Malachy MoanHeath  McCullough M.D.   On: 10/14/2013 08:21    EKG Interpretation    Date/Time:  Saturday October 14 2013 07:41:34 EST Ventricular Rate:  64 PR Interval:  172 QRS Duration: 84 QT Interval:  437 QTC Calculation: 451 R Axis:   47 Text Interpretation:  Sinus rhythm Abnrm T, consider ischemia, anterolateral lds T wave inversion in V2 is new Confirmed by Kishan Wachsmuth  MD, Keiran Gaffey ([id number]) on 10/14/2013 7:57:18 AM            MDM  Diagnosis: Post-partem Cardiomyopathy; CHF  Patient presents to the ER for evaluation of shortness of breath. Patient has had progressively worsening shortness of breath, dyspnea on exertion and swelling of the legs.  Patient has a complicated history of postpartum cardiomyopathy with a previous pregnancy as well as hypertension. Patient's blood pressures, however, have been normal throughout this current pregnancy and she has not required any treatment. She had an echo performed in November that showed normal heart size and normal ejection fraction.  The patient started having elevated blood pressures after delivery. [facility] days ago with blood pressures of 190/100. She was initiated on Lopressor and hydrochlorothiazide. Today her blood pressures are still elevated 190s over 100 she is experiencing increasing shortness of breath and dyspnea on exertion worrisome for congestive heart failure. Chest x-ray shows enlarged heart without overt edema. Patient has elevated BNP and current presentation is consistent with congestive heart failure secondary to postpartum cardiomyopathy.  Postpartum preeclampsia is also considered. She does have small protein in her urine, but this has been persistent throughout the pregnancy. She has normal reflexes.  Case discussed with [facility]. She does confirm that treatment would be blood pressure control even if we felt this was preeclampsia. No magnesium will be administered, especially with her pre-existing cardiac history.  Case discussed with Doctor Diona BrownerMcDowell, on call for cardiology. Patient will require repeat echo and further management.  Case discussed with Doctor Mellody DrownKrishna, call for the hospitalist service. Will initiate diuresis with Lasix and follow blood pressures and improvement in condition. Patient to be admitted to telemetry.   Gilda Creasehristopher J. Eloyse Causey, MD 10/14/13 1105

## 2013-10-14 NOTE — ED Notes (Signed)
MD at bedside. 

## 2013-10-14 NOTE — H&P (Signed)
[facility] History and Physical  Tanya Fry NFA:[id number] DOB: 14-May-1971 DOA: 10/14/2013  Referring physician: Blinda Leatherwood, ER physician PCP: Dorrene German, MD   Chief Complaint: Headache and shortness of breath  HPI: Tanya Fry is a 43 y.o. female  Past medical history of mild hypertension and postpartum cardiomyopathy x1 year after delivery of her second child who 1 week ago after an uncomplicated pregnancy, delivered her fourth child. Patient noted upon delivery, that her legs are beginning to swell and she restarted her hydrochlorothiazide. Patient having some increased shortness of breath a few days after and went to [facility] where she was noted to have elevated blood pressure. She was treated and then released. She started having significant headaches and shortness of breath in the last day and became concerned and went to the emergency room for further evaluation. There, she was noted to have blood pressure 171/101 and a pro BNP of 2377. Lab work was unremarkable. Cardiology was consulted and hospitalists were called for further evaluation and admission.   Review of Systems:  Patient seen after arrival to floor. Feeling better. Less of a headache. Denies any vision changes, dysphasia, chest pain or palpitations. Her breathing is getting easier. Denies any wheezing. She has a nonproductive cough earlier, but ascites.. Denies abdominal pain, hematuria, dysuria, constipation, diarrhea, focal extremity numbness or weakness or pain. Review systems is otherwise negative.  Past Medical History  Diagnosis Date  . Hypertension     CHTN, no current meds  . Anemia   . Postpartum cardiomyopathy    Past Surgical History  Procedure Laterality Date  . No past surgeries    . Cervical cerclage    . Tubal ligation Bilateral 10/03/2013    Procedure: POST PARTUM TUBAL LIGATION;  Surgeon: Tereso Newcomer, MD;  Location: [facility];  Service: Gynecology;  Laterality: Bilateral;    Social History:  reports that she quit smoking about 9 months ago. She has never used smokeless tobacco. She reports that she drinks alcohol. She reports that she does not use illicit drugs. patient lives at home with her family. She is normally able to participate in all activities of daily living without assistance  Allergies  Allergen Reactions  . Shellfish Allergy Anaphylaxis    Family History  Problem Relation Age of Onset  . Cancer Maternal Grandmother     breast cancer  . Cancer Paternal Grandfather     prostate  . Heart failure Father 50     Prior to Admission medications   Medication Sig Start Date End Date Taking? Authorizing Provider  acetaminophen (TYLENOL) 500 MG tablet Take 1,000 mg by mouth every 6 (six) hours as needed for mild pain.    Yes Historical Provider, MD  aspirin (ASPIRIN CHILDRENS) 81 MG chewable tablet Chew 1 tablet (81 mg total) by mouth daily. 10/10/13  Yes Heather Alger Memos, CNM  hydrochlorothiazide (HYDRODIURIL) 25 MG tablet Take 1 tablet (25 mg total) by mouth daily. 10/10/13  Yes Heather Alger Memos, CNM  ibuprofen (ADVIL,MOTRIN) 600 MG tablet Take 1 tablet (600 mg total) by mouth every 6 (six) hours. 10/04/13  Yes Drexel Iha, MD  metoprolol tartrate (LOPRESSOR) 25 MG tablet Take 1 tablet (25 mg total) by mouth 2 (two) times daily. 10/10/13  Yes Heather Alger Memos, CNM  oxyCODONE-acetaminophen (ROXICET) 5-325 MG per tablet Take 1-2 tablets by mouth every 4 (four) hours as needed for severe pain. 10/10/13  Yes Tawnya Crook, CNM   Physical Exam: Filed Vitals:  10/14/13 1159  BP: 180/100  Pulse: 65  Temp: 97.7 F (36.5 C)  Resp: 18    BP 180/100  Pulse 65  Temp(Src) 97.7 F (36.5 C) (Oral)  Resp 18  Ht 5' 6.5" (1.689 m)  Wt 101.7 kg (224 lb 3.3 oz)  BMI 35.65 kg/m2  SpO2 97%  LMP 01/03/2013  General:  Appears calm and comfortable Eyes: PERRL, normal lids, irises & conjunctiva ENT: grossly normal hearing, lips &  tongue Neck: no LAD, masses or thyromegaly Cardiovascular: RRR, no m/r/g. No LE edema. Telemetry: SR, no arrhythmias  Respiratory: CTA bilaterally, no w/r/r. Normal respiratory effort. Abdomen: soft, ntnd Skin: no rash or induration seen on limited exam Musculoskeletal: grossly normal tone BUE/BLE Psychiatric: grossly normal mood and affect, speech fluent and appropriate Neurologic: grossly non-focal.          Labs on Admission:  Basic Metabolic Panel:  Recent Labs Lab 10/10/13 1427 10/14/13 0800  NA 142 141  K 4.1 3.6*  CL 104 104  CO2 27 25  GLUCOSE 82 84  BUN 15 19  CREATININE 0.85 0.82  CALCIUM 8.9 8.3*   Liver Function Tests:  Recent Labs Lab 10/10/13 1427 10/14/13 0800  AST 13 12  ALT 17 17  ALKPHOS 88 87  BILITOT <0.2* <0.2*  PROT 6.7 6.5  ALBUMIN 2.9* 2.8*   No results found for this basename: LIPASE, AMYLASE,  in the last 168 hours No results found for this basename: AMMONIA,  in the last 168 hours CBC:  Recent Labs Lab 10/10/13 1427 10/14/13 0800  WBC 8.9 6.2  NEUTROABS  --  4.0  HGB 9.9* 10.2*  HCT 29.7* 30.9*  MCV 90.5 90.9  PLT 312 340   Cardiac Enzymes:  Recent Labs Lab 10/14/13 0800  TROPONINI <0.30    BNP (last 3 results)  Recent Labs  10/14/13 0800  PROBNP 2377.0*   CBG: No results found for this basename: GLUCAP,  in the last 168 hours  Radiological Exams on Admission: Dg Chest 2 View  10/14/2013   CLINICAL DATA:  Cough, congestion, short of breath for 5 days. Recently postpartum.  EXAM: CHEST  2 VIEW  COMPARISON:  Prior chest x-ray 01/05/2013  FINDINGS: Diffuse peribronchial cuffing and mild interstitial prominence with linear atelectasis in the right middle lobe. No discrete focal airspace consolidation. No pleural effusion or pneumothorax. Query mild interval enlargement of the cardiopericardial silhouette. No acute osseous abnormality.  IMPRESSION: 1. Central airway thickening/peribronchial cuffing, mild interstitial  prominence and linear atelectasis in the right middle lobe. While nonspecific, findings are most suggestive of atypical or viral pneumonia. Query clinical history of influenza? 2. Perhaps mild interval enlargement of the cardiopericardial silhouette. While this may be related to differences in technique, small pericardial effusion or interval cardiac enlargement is not excluded entirely.   Electronically Signed   By: Malachy Moan M.D.   On: 10/14/2013 08:21    EKG: Independently reviewed. Sinus rhythm with T-wave inversion in V2  Assessment/Plan Principal Problem:   Acute exacerbation of CHF (congestive heart failure): Likely from peripartum cardiomyopathy. Appreciate cardiology help. Diurese. 2-D echo repeated ordered. On beta blocker. Holding on ACE inhibitor for now Active Problems:   Peripartum cardiomyopathy   Malignant hypertension: Should improve nicely as patient diuresis. On labetalol    Code Status: Full code  Family Communication: No family present, left message.  Disposition Plan: Dependent on echo and once patient fully diuresed, could go home as early as tomorrow or Monday  Time spent:  35 minutes  Hollice EspyKRISHNAN,Richie Bonanno K [facility] Pager [phone]

## 2013-10-14 NOTE — ED Notes (Addendum)
Pt states she feels she cannot get full breath in when breathing deeply, pt speaking in complete sentences, no dyspnea. Pt also complains of HA, sore throat and nasal stuffiness. Pt states symptoms began 5 days ago, pt states she had a baby a week ago. Denies any pregnancy complications, states she had high blood pressure and post partum cardiomyopathy after 2nd child born. Pt state she just had 4th child.

## 2013-10-14 NOTE — ED Notes (Signed)
Dr. Blinda LeatherwoodPollina aware of BP.

## 2013-10-15 DIAGNOSIS — I059 Rheumatic mitral valve disease, unspecified: Secondary | ICD-10-CM

## 2013-10-15 DIAGNOSIS — E876 Hypokalemia: Secondary | ICD-10-CM

## 2013-10-15 LAB — BASIC METABOLIC PANEL
BUN: 20 mg/dL (ref 6–23)
CO2: 26 mEq/L (ref 19–32)
CREATININE: 0.83 mg/dL (ref 0.50–1.10)
Calcium: 8.5 mg/dL (ref 8.4–10.5)
Chloride: 101 mEq/L (ref 96–112)
GFR calc non Af Amer: 85 mL/min — ABNORMAL LOW (ref >90)
Glucose, Bld: 89 mg/dL (ref 70–99)
Potassium: 3.1 mEq/L — ABNORMAL LOW (ref 3.7–5.3)
Sodium: 140 mEq/L (ref 137–147)

## 2013-10-15 LAB — TSH: TSH: 0.835 u[IU]/mL (ref 0.350–4.500)

## 2013-10-15 MED ORDER — POTASSIUM CHLORIDE CRYS ER 20 MEQ PO TBCR
40.0000 meq | EXTENDED_RELEASE_TABLET | Freq: Once | ORAL | Status: AC
  Administered 2013-10-15: 40 meq via ORAL
  Filled 2013-10-15: qty 2

## 2013-10-15 MED ORDER — ZOLPIDEM TARTRATE 5 MG PO TABS
5.0000 mg | ORAL_TABLET | Freq: Once | ORAL | Status: AC
  Administered 2013-10-15: 5 mg via ORAL
  Filled 2013-10-15: qty 1

## 2013-10-15 NOTE — Progress Notes (Signed)
  Echocardiogram 2D Echocardiogram has been performed.  Arvil ChacoFoster, Lisbeth Puller 10/15/2013, 5:23 PM

## 2013-10-15 NOTE — Progress Notes (Signed)
This shift Lasix given with output results >1400.

## 2013-10-15 NOTE — Progress Notes (Signed)
Pt c/o of sore throat, headache and neck hurting when she bends it forward or side to side.

## 2013-10-15 NOTE — Progress Notes (Signed)
[facility] PROGRESS NOTE  SHARONLEE NINE ZOX:[id number] DOB: 12/22/1970 DOA: 10/14/2013 PCP: Dorrene German, MD  Assessment/Plan: Acute exacerbation of CHF (congestive heart failure) - Awaiting results of echocardiogram and recommendations from cardiologist.  Active Problems:   Peripartum cardiomyopathy - Cardiology on board as mentioned above will await the recommendations    Malignant hypertension - Patient currently on labetalol and hydrochlorothiazide - Will need testing for secondary causes of hypertension once discharged.  Hypokalemia - Replace orally - Reassess next a.m. - Check magnesium levels   Code Status: Full code Family Communication: No family at bedside Disposition Plan: Pending recommendations from cardiologist. Improved blood pressures and improved potassium levels. Most likely DC within the next one or 2 days   Consultants:  Cardiology  Procedures:  Echocardiogram results pending  Antibiotics:  None  HPI/Subjective: Patient has no new complaints  Objective: Filed Vitals:   10/15/13 1500  BP: 149/94  Pulse: 64  Temp:   Resp:     Intake/Output Summary (Last 24 hours) at 10/15/13 1833 Last data filed at 10/15/13 1000  Gross per 24 hour  Intake    363 ml  Output   1400 ml  Net  -1037 ml   Filed Weights   10/14/13 1159 10/14/13 1423 10/15/13 0500  Weight: 101.7 kg (224 lb 3.3 oz) 101.7 kg (224 lb 3.3 oz) 99.202 kg (218 lb 11.2 oz)    Exam:   General:  Patient in no acute distress, alert and awake, patient sitting up smiling  Cardiovascular: Regular rate and rhythm, no murmurs or rubs  Respiratory: Clear to auscultation bilaterally no wheezes  Abdomen: Soft, nontender nondistended   Musculoskeletal: no cyanosis or clubbing   Data Reviewed: Basic Metabolic Panel:  Recent Labs Lab 10/10/13 1427 10/14/13 0800 10/15/13 0559  NA 142 141 140  K 4.1 3.6* 3.1*  CL 104 104 101  CO2 27 25 26   GLUCOSE 82 84 89  BUN 15  19 20   CREATININE 0.85 0.82 0.83  CALCIUM 8.9 8.3* 8.5   Liver Function Tests:  Recent Labs Lab 10/10/13 1427 10/14/13 0800  AST 13 12  ALT 17 17  ALKPHOS 88 87  BILITOT <0.2* <0.2*  PROT 6.7 6.5  ALBUMIN 2.9* 2.8*   No results found for this basename: LIPASE, AMYLASE,  in the last 168 hours No results found for this basename: AMMONIA,  in the last 168 hours CBC:  Recent Labs Lab 10/10/13 1427 10/14/13 0800  WBC 8.9 6.2  NEUTROABS  --  4.0  HGB 9.9* 10.2*  HCT 29.7* 30.9*  MCV 90.5 90.9  PLT 312 340   Cardiac Enzymes:  Recent Labs Lab 10/14/13 0800  TROPONINI <0.30   BNP (last 3 results)  Recent Labs  10/14/13 0800  PROBNP 2377.0*   CBG: No results found for this basename: GLUCAP,  in the last 168 hours  No results found for this or any previous visit (from the past 240 hour(s)).   Studies: Dg Chest 2 View  10/14/2013   CLINICAL DATA:  Cough, congestion, short of breath for 5 days. Recently postpartum.  EXAM: CHEST  2 VIEW  COMPARISON:  Prior chest x-ray 01/05/2013  FINDINGS: Diffuse peribronchial cuffing and mild interstitial prominence with linear atelectasis in the right middle lobe. No discrete focal airspace consolidation. No pleural effusion or pneumothorax. Query mild interval enlargement of the cardiopericardial silhouette. No acute osseous abnormality.  IMPRESSION: 1. Central airway thickening/peribronchial cuffing, mild interstitial prominence and linear atelectasis in the right middle lobe.  While nonspecific, findings are most suggestive of atypical or viral pneumonia. Query clinical history of influenza? 2. Perhaps mild interval enlargement of the cardiopericardial silhouette. While this may be related to differences in technique, small pericardial effusion or interval cardiac enlargement is not excluded entirely.   Electronically Signed   By: Malachy MoanHeath  McCullough M.D.   On: 10/14/2013 08:21    Scheduled Meds: . aspirin  81 mg Oral Daily  . enoxaparin  (LOVENOX) injection  40 mg Subcutaneous Q24H  . furosemide  20 mg Intravenous Q12H  . hydrochlorothiazide  25 mg Oral Daily  . labetalol  100 mg Oral BID  . sodium chloride  3 mL Intravenous Q12H   Continuous Infusions:   Principal Problem:   Acute exacerbation of CHF (congestive heart failure) Active Problems:   Peripartum cardiomyopathy   Malignant hypertension    Time spent: > 30 minutes    [name], Misako Roeder  [facility] Pager [phone] If 7PM-7AM, please contact night-coverage at [email or web address], password [email or web address] 10/15/2013, 6:33 PM  LOS: 1 day

## 2013-10-15 NOTE — Progress Notes (Signed)
Subjective:  Mostly admitted with headache and elevated BP yesterday.  C/o headache, sore throat and stiff neck this am.  No SOB.   Objective:  Vital Signs in the last 24 hours: BP 147/85  Pulse 68  Temp(Src) 98.3 F (36.8 C) (Oral)  Resp 17  Ht 5' 6.5" (1.689 m)  Wt 99.202 kg (218 lb 11.2 oz)  BMI 34.77 kg/m2  SpO2 97%  LMP 01/03/2013  Physical Exam: Pleasant BF in NAD Lungs:  Clear  Cardiac:  Regular rhythm, normal S1 and S2, no S3 Abdomen:  Soft, nontender, no masses Extremities: Trace edema present  Intake/Output from previous day: 01/03 0701 - 01/04 0700 In: 240 [P.O.:240] Out: 1900 [Urine:1900] Weight Filed Weights   10/14/13 1159 10/14/13 1423 10/15/13 0500  Weight: 101.7 kg (224 lb 3.3 oz) 101.7 kg (224 lb 3.3 oz) 99.202 kg (218 lb 11.2 oz)    Lab Results: Basic Metabolic Panel:  Recent Labs  78/29/5599/12/25 0800 10/15/13 0559  NA 141 140  K 3.6* 3.1*  CL 104 101  CO2 25 26  GLUCOSE 84 89  BUN 19 20  CREATININE 0.82 0.83    CBC:  Recent Labs  10/14/13 0800  WBC 6.2  NEUTROABS 4.0  HGB 10.2*  HCT 30.9*  MCV 90.9  PLT 340    BNP    Component Value Date/Time   PROBNP 2377.0* 10/14/2013 0800   Telemetry: Sinus rhythm  Assessment/Plan:  1. Clinical syndrome of headache, sore throat and stiff neck ? Viral 2. Hypertension with some worsening that could be secondary to pregnancy 3. History of peripartum cardiomyopathy  Rec:  Continue labetalol.  Await ECHO today. Replete K.      Darden PalmerW. Spencer Kerem Gilmer, Jr.  MD [facility] Cardiology  10/15/2013, 8:13 AM

## 2013-10-16 DIAGNOSIS — I509 Heart failure, unspecified: Secondary | ICD-10-CM

## 2013-10-16 DIAGNOSIS — I10 Essential (primary) hypertension: Secondary | ICD-10-CM

## 2013-10-16 LAB — BASIC METABOLIC PANEL
BUN: 22 mg/dL (ref 6–23)
CHLORIDE: 100 meq/L (ref 96–112)
CO2: 25 meq/L (ref 19–32)
Calcium: 8.4 mg/dL (ref 8.4–10.5)
Creatinine, Ser: 0.84 mg/dL (ref 0.50–1.10)
GFR calc Af Amer: >90 mL/min (ref >90)
GFR calc non Af Amer: 83 mL/min — ABNORMAL LOW (ref >90)
Glucose, Bld: 95 mg/dL (ref 70–99)
POTASSIUM: 3.3 meq/L — AB (ref 3.7–5.3)
Sodium: 139 mEq/L (ref 137–147)

## 2013-10-16 LAB — MAGNESIUM: Magnesium: 1.9 mg/dL (ref 1.5–2.5)

## 2013-10-16 MED ORDER — POTASSIUM CHLORIDE CRYS ER 20 MEQ PO TBCR
40.0000 meq | EXTENDED_RELEASE_TABLET | Freq: Once | ORAL | Status: AC
  Administered 2013-10-16: 40 meq via ORAL
  Filled 2013-10-16: qty 2

## 2013-10-16 MED ORDER — LABETALOL HCL 100 MG PO TABS
100.0000 mg | ORAL_TABLET | Freq: Once | ORAL | Status: AC
  Administered 2013-10-16: 100 mg via ORAL
  Filled 2013-10-16: qty 1

## 2013-10-16 MED ORDER — ZOLPIDEM TARTRATE 5 MG PO TABS
5.0000 mg | ORAL_TABLET | Freq: Once | ORAL | Status: AC
Start: 2013-10-16 — End: 2013-10-16
  Administered 2013-10-16: 5 mg via ORAL
  Filled 2013-10-16: qty 1

## 2013-10-16 MED ORDER — LABETALOL HCL 200 MG PO TABS
200.0000 mg | ORAL_TABLET | Freq: Two times a day (BID) | ORAL | Status: DC
  Administered 2013-10-16: 200 mg via ORAL
  Filled 2013-10-16 (×3): qty 1

## 2013-10-16 NOTE — Progress Notes (Signed)
[facility] PROGRESS NOTE  Tanya Fry ZOX:[id number] DOB: [birth year] DOA: 10/14/2013 PCP: Tanya German, MD  Assessment/Plan: Acute exacerbation of CHF (congestive heart failure) - Awaiting results of echocardiogram and recommendations from cardiologist. - Plan will be to transition lasix to oral regimen next am. - Once cleared for discharge from cardiology standpoint will plan on discharging patient.  Active Problems:   Peripartum cardiomyopathy - Cardiology on board as mentioned above will await the recommendations    Malignant hypertension - Patient currently on labetalol and hydrochlorothiazide - Will need testing for secondary causes of hypertension once discharged. - Will continue to monitor blood pressures  Hypokalemia - Replace orally - Reassess next a.m. - Magnesium levels WNL   Code Status: Full code Family Communication: No family at bedside Disposition Plan: Once cleared to go home from cardiology standpoint.   Consultants:  Cardiology  Procedures:  Echocardiogram results pending  Antibiotics:  None  HPI/Subjective: Patient has no new complaints. No acute problems reported to me by patient overnight  Objective: Filed Vitals:   10/16/13 1355  BP: 135/81  Pulse: 85  Temp: 98.3 F (36.8 C)  Resp: 18    Intake/Output Summary (Last 24 hours) at 10/16/13 1719 Last data filed at 10/16/13 1613  Gross per 24 hour  Intake    483 ml  Output   3440 ml  Net  -2957 ml   Filed Weights   10/14/13 1423 10/15/13 0500 10/16/13 0548  Weight: 101.7 kg (224 lb 3.3 oz) 99.202 kg (218 lb 11.2 oz) 98.2 kg (216 lb 7.9 oz)    Exam:   General:  Patient in no acute distress, alert and awake, patient sitting up smiling  Cardiovascular: Regular rate and rhythm, no murmurs or rubs  Respiratory: Clear to auscultation bilaterally no wheezes, breathing comfortably on room air while sitting in bed  Abdomen: Soft, nontender nondistended    Musculoskeletal: no cyanosis or clubbing   Data Reviewed: Basic Metabolic Panel:  Recent Labs Lab 10/10/13 1427 10/14/13 0800 10/15/13 0559 10/16/13 0554  NA 142 141 140 139  K 4.1 3.6* 3.1* 3.3*  CL 104 104 101 100  CO2 27 25 26 25   GLUCOSE 82 84 89 95  BUN 15 19 20 22   CREATININE 0.85 0.82 0.83 0.84  CALCIUM 8.9 8.3* 8.5 8.4  MG  --   --   --  1.9   Liver Function Tests:  Recent Labs Lab 10/10/13 1427 10/14/13 0800  AST 13 12  ALT 17 17  ALKPHOS 88 87  BILITOT <0.2* <0.2*  PROT 6.7 6.5  ALBUMIN 2.9* 2.8*   No results found for this basename: LIPASE, AMYLASE,  in the last 168 hours No results found for this basename: AMMONIA,  in the last 168 hours CBC:  Recent Labs Lab 10/10/13 1427 10/14/13 0800  WBC 8.9 6.2  NEUTROABS  --  4.0  HGB 9.9* 10.2*  HCT 29.7* 30.9*  MCV 90.5 90.9  PLT 312 340   Cardiac Enzymes:  Recent Labs Lab 10/14/13 0800  TROPONINI <0.30   BNP (last 3 results)  Recent Labs  10/14/13 0800  PROBNP 2377.0*   CBG: No results found for this basename: GLUCAP,  in the last 168 hours  No results found for this or any previous visit (from the past 240 hour(s)).   Studies: No results found.  Scheduled Meds: . aspirin  81 mg Oral Daily  . enoxaparin (LOVENOX) injection  40 mg Subcutaneous Q24H  . furosemide  20 mg  Intravenous Q12H  . hydrochlorothiazide  25 mg Oral Daily  . labetalol  200 mg Oral BID  . sodium chloride  3 mL Intravenous Q12H   Continuous Infusions:   Principal Problem:   Acute exacerbation of CHF (congestive heart failure) Active Problems:   Peripartum cardiomyopathy   Malignant hypertension    Time spent: > 30 minutes    [name], [name]  [facility] Pager [phone] If 7PM-7AM, please contact night-coverage at [email or web address], password [email or web address] 10/16/2013, 5:19 PM  LOS: 2 days

## 2013-10-16 NOTE — Progress Notes (Signed)
Patient ID: Tanya Fry, female   DOB: 05/04/1971, 43 y.o.   MRN: [id number]   SUBJECTIVE: No dyspnea at rest.  BP still high.  Echo not read yet.  Marland Kitchen aspirin  81 mg Oral Daily  . enoxaparin (LOVENOX) injection  40 mg Subcutaneous Q24H  . furosemide  20 mg Intravenous Q12H  . hydrochlorothiazide  25 mg Oral Daily  . labetalol  100 mg Oral Once  . labetalol  200 mg Oral BID  . potassium chloride  40 mEq Oral Once  . sodium chloride  3 mL Intravenous Q12H     Filed Vitals:   10/15/13 2154 10/15/13 2355 10/16/13 0548 10/16/13 0908  BP: 169/94 155/84 151/88 151/88  Pulse: 99 69 75 75  Temp: 97.8 F (36.6 C)  97.9 F (36.6 C)   TempSrc: Oral  Oral   Resp: 16  16   Height:      Weight:   98.2 kg (216 lb 7.9 oz)   SpO2: 98%  97%     Intake/Output Summary (Last 24 hours) at 10/16/13 1030 Last data filed at 10/16/13 1013  Gross per 24 hour  Intake    960 ml  Output   3000 ml  Net  -2040 ml    LABS: Basic Metabolic Panel:  Recent Labs  40/98/11 0559 10/16/13 0554  NA 140 139  K 3.1* 3.3*  CL 101 100  CO2 26 25  GLUCOSE 89 95  BUN 20 22  CREATININE 0.83 0.84  CALCIUM 8.5 8.4  MG  --  1.9   Liver Function Tests:  Recent Labs  10/14/13 0800  AST 12  ALT 17  ALKPHOS 87  BILITOT <0.2*  PROT 6.5  ALBUMIN 2.8*   No results found for this basename: LIPASE, AMYLASE,  in the last 72 hours CBC:  Recent Labs  10/14/13 0800  WBC 6.2  NEUTROABS 4.0  HGB 10.2*  HCT 30.9*  MCV 90.9  PLT 340   Cardiac Enzymes:  Recent Labs  10/14/13 0800  TROPONINI <0.30   BNP: No components found with this basename: POCBNP,  D-Dimer: No results found for this basename: DDIMER,  in the last 72 hours Hemoglobin A1C: No results found for this basename: HGBA1C,  in the last 72 hours Fasting Lipid Panel: No results found for this basename: CHOL, HDL, LDLCALC, TRIG, CHOLHDL, LDLDIRECT,  in the last 72 hours Thyroid Function Tests:  Recent Labs  10/14/13 1457  TSH  0.835   Anemia Panel: No results found for this basename: VITAMINB12, FOLATE, FERRITIN, TIBC, IRON, RETICCTPCT,  in the last 72 hours  RADIOLOGY: Dg Chest 2 View  10/14/2013   CLINICAL DATA:  Cough, congestion, short of breath for 5 days. Recently postpartum.  EXAM: CHEST  2 VIEW  COMPARISON:  Prior chest x-ray 01/05/2013  FINDINGS: Diffuse peribronchial cuffing and mild interstitial prominence with linear atelectasis in the right middle lobe. No discrete focal airspace consolidation. No pleural effusion or pneumothorax. Query mild interval enlargement of the cardiopericardial silhouette. No acute osseous abnormality.  IMPRESSION: 1. Central airway thickening/peribronchial cuffing, mild interstitial prominence and linear atelectasis in the right middle lobe. While nonspecific, findings are most suggestive of atypical or viral pneumonia. Query clinical history of influenza? 2. Perhaps mild interval enlargement of the cardiopericardial silhouette. While this may be related to differences in technique, small pericardial effusion or interval cardiac enlargement is not excluded entirely.   Electronically Signed   By: Malachy Moan M.D.   On: 10/14/2013  08:21   [place] Ob Follow Up  09/28/2013   OBSTETRICAL ULTRASOUND: This exam was performed within a [place] [facility]. The OB US report was generated in the AS system, and faxed to the ordering physician.   This report is also available in [facility] [employer] and in the [employer] PACS. See AS Obstetric US report.  [place] Fetal Bpp W/o Non Stress  10/02/2013   OBSTETRICAL ULTRASOUND: This exam was performed within a [place] [facility]. The OB US report was generated in the AS system, and faxed to the ordering physician.   This report is also available in [facility] [employer] and in the [employer] PACS. See AS Obstetric US report.   PHYSICAL EXAM General: NAD Neck: JVP 8 cm, no thyromegaly or thyroid nodule.    Lungs: CTAB CV: Nondisplaced PMI.  Heart regular S1/S2, no S3/S4, no murmur.  No peripheral edema.  No carotid bruit.  Normal pedal pulses.  Abdomen: Soft, nontender, no hepatosplenomegaly, no distention.  Neurologic: Alert and oriented x 3.  Psych: Normal affect. Extremities: No clubbing or cyanosis.   TELEMETRY: Reviewed telemetry pt in NSR  ASSESSMENT AND PLAN: 43 yo with history of prior peri-partum CMP that recovered, now s/p another pregnancy and delivery, [facility] headache, dyspnea, and lower extremity edema.  BP has been elevated.  1. H/o peri-partum CMP: Awaiting read on echo from yesterday evening. Peri-partum CMP noted after an earlier pregnancy.  She has had tubal ligation and will not be getting pregnant again.  Volume looks improved.  - Could continue IV Lasix today but after today transition to po.  - No ACEI or spironolactone with breastfeeding.  - Labatelol is probably the best beta blocker with breast feeding (Coreg not recommended).  - Can use hydralazine/nitrates carefully if EF is down.  2. HTN: Continue HCTZ, increase labetalol to 200 mg bid.  Given breast feeding, next step would be hydralazine.   Marca AnconaDalton Eirene Rather 10/16/2013 10:34 AM

## 2013-10-16 NOTE — Progress Notes (Signed)
Pt waiting for daughter to bring up her breast pump, states she is leaking.

## 2013-10-17 ENCOUNTER — Encounter (HOSPITAL_COMMUNITY): Payer: Self-pay | Admitting: *Deleted

## 2013-10-17 LAB — BASIC METABOLIC PANEL
BUN: 21 mg/dL (ref 6–23)
CHLORIDE: 99 meq/L (ref 96–112)
CO2: 26 meq/L (ref 19–32)
Calcium: 8.8 mg/dL (ref 8.4–10.5)
Creatinine, Ser: 0.81 mg/dL (ref 0.50–1.10)
GFR calc non Af Amer: 87 mL/min — ABNORMAL LOW (ref >90)
Glucose, Bld: 99 mg/dL (ref 70–99)
Potassium: 3.6 mEq/L — ABNORMAL LOW (ref 3.7–5.3)
Sodium: 139 mEq/L (ref 137–147)

## 2013-10-17 LAB — TROPONIN I: Troponin I: <0.3 ng/mL (ref <0.30)

## 2013-10-17 MED ORDER — HYDROCHLOROTHIAZIDE 50 MG PO TABS
50.0000 mg | ORAL_TABLET | Freq: Every day | ORAL | Status: DC
  Administered 2013-10-17: 50 mg via ORAL
  Filled 2013-10-17: qty 1

## 2013-10-17 MED ORDER — POTASSIUM CHLORIDE CRYS ER 20 MEQ PO TBCR
40.0000 meq | EXTENDED_RELEASE_TABLET | Freq: Once | ORAL | Status: AC
  Administered 2013-10-17: 40 meq via ORAL
  Filled 2013-10-17: qty 2

## 2013-10-17 MED ORDER — LABETALOL HCL 300 MG PO TABS
300.0000 mg | ORAL_TABLET | Freq: Two times a day (BID) | ORAL | Status: DC
  Administered 2013-10-17: 300 mg via ORAL
  Filled 2013-10-17 (×2): qty 1

## 2013-10-17 MED ORDER — POTASSIUM CHLORIDE CRYS ER 20 MEQ PO TBCR: 20.0000 meq | EXTENDED_RELEASE_TABLET | Freq: Every day | ORAL | Status: DC

## 2013-10-17 MED ORDER — LABETALOL HCL 300 MG PO TABS: 300.0000 mg | ORAL_TABLET | Freq: Two times a day (BID) | ORAL | Status: DC

## 2013-10-17 MED ORDER — HYDROCHLOROTHIAZIDE 50 MG PO TABS: 50.0000 mg | ORAL_TABLET | Freq: Every day | ORAL | Status: DC

## 2013-10-17 NOTE — Progress Notes (Signed)
Patient ID: Tanya Fry, female   DOB: 1970-12-16, 43 y.o.   MRN: [id number] Patient ID: Tanya Fry, female   DOB: June 04, 1971, 43 y.o.   MRN: [id number]    SUBJECTIVE: No dyspnea. BP better.  Had some mild chest pain this morning. ECG was done and was normal.    . aspirin  81 mg Oral Daily  . enoxaparin (LOVENOX) injection  40 mg Subcutaneous Q24H  . hydrochlorothiazide  50 mg Oral Daily  . labetalol  300 mg Oral BID  . potassium chloride  40 mEq Oral Once  . sodium chloride  3 mL Intravenous Q12H     Filed Vitals:   10/16/13 1355 10/16/13 2151 10/17/13 0550 10/17/13 0713  BP: 135/81 146/98 142/91 135/89  Pulse: 85 82 77 78  Temp: 98.3 F (36.8 C) 97.8 F (36.6 C) 97.8 F (36.6 C) 97.8 F (36.6 C)  TempSrc: Oral Oral Oral Oral  Resp: 18 18 20 20   Height:      Weight:   96.3 kg (212 lb 4.9 oz)   SpO2: 98% 96% 99% 98%    Intake/Output Summary (Last 24 hours) at 10/17/13 0754 Last data filed at 10/17/13 0630  Gross per 24 hour  Intake   1560 ml  Output   3700 ml  Net  -2140 ml    LABS: Basic Metabolic Panel:  Recent Labs  24/40/10 0559 10/16/13 0554 10/17/13 0600  NA 140 139 139  K 3.1* 3.3* 3.6*  CL 101 100 99  CO2 26 25 26   GLUCOSE 89 95 99  BUN 20 22 21   CREATININE 0.83 0.84 0.81  CALCIUM 8.5 8.4 8.8  MG  --  1.9  --    Liver Function Tests:  Recent Labs  10/14/13 0800  AST 12  ALT 17  ALKPHOS 87  BILITOT <0.2*  PROT 6.5  ALBUMIN 2.8*   No results found for this basename: LIPASE, AMYLASE,  in the last 72 hours CBC:  Recent Labs  10/14/13 0800  WBC 6.2  NEUTROABS 4.0  HGB 10.2*  HCT 30.9*  MCV 90.9  PLT 340   Cardiac Enzymes:  Recent Labs  10/14/13 0800  TROPONINI <0.30   BNP: No components found with this basename: POCBNP,  D-Dimer: No results found for this basename: DDIMER,  in the last 72 hours Hemoglobin A1C: No results found for this basename: HGBA1C,  in the last 72 hours Fasting Lipid Panel: No results found  for this basename: CHOL, HDL, LDLCALC, TRIG, CHOLHDL, LDLDIRECT,  in the last 72 hours Thyroid Function Tests:  Recent Labs  10/14/13 1457  TSH 0.835   Anemia Panel: No results found for this basename: VITAMINB12, FOLATE, FERRITIN, TIBC, IRON, RETICCTPCT,  in the last 72 hours  RADIOLOGY: Dg Chest 2 View  10/14/2013   CLINICAL DATA:  Cough, congestion, short of breath for 5 days. Recently postpartum.  EXAM: CHEST  2 VIEW  COMPARISON:  Prior chest x-ray 01/05/2013  FINDINGS: Diffuse peribronchial cuffing and mild interstitial prominence with linear atelectasis in the right middle lobe. No discrete focal airspace consolidation. No pleural effusion or pneumothorax. Query mild interval enlargement of the cardiopericardial silhouette. No acute osseous abnormality.  IMPRESSION: 1. Central airway thickening/peribronchial cuffing, mild interstitial prominence and linear atelectasis in the right middle lobe. While nonspecific, findings are most suggestive of atypical or viral pneumonia. Query clinical history of influenza? 2. Perhaps mild interval enlargement of the cardiopericardial silhouette. While this may be related to differences in technique,  small pericardial effusion or interval cardiac enlargement is not excluded entirely.   Electronically Signed   By: Malachy MoanHeath  McCullough M.D.   On: 10/14/2013 08:21   [place] Ob Follow Up  09/28/2013   OBSTETRICAL ULTRASOUND: This exam was performed within a [place] [facility]. The OB US report was generated in the AS system, and faxed to the ordering physician.   This report is also available in [facility] [employer] and in the [employer] PACS. See AS Obstetric US report.  [place] Fetal Bpp W/o Non Stress  10/02/2013   OBSTETRICAL ULTRASOUND: This exam was performed within a [place] [facility]. The OB US report was generated in the AS system, and faxed to the ordering physician.   This report is also available in [facility]  [employer] and in the [employer] PACS. See AS Obstetric US report.   PHYSICAL EXAM General: NAD Neck: JVP 8 cm, no thyromegaly or thyroid nodule.  Lungs: CTAB CV: Nondisplaced PMI.  Heart regular S1/S2, no S3/S4, no murmur.  No peripheral edema.  No carotid bruit.  Normal pedal pulses.  Abdomen: Soft, nontender, no hepatosplenomegaly, no distention.  Neurologic: Alert and oriented x 3.  Psych: Normal affect. Extremities: No clubbing or cyanosis.   TELEMETRY: Reviewed telemetry pt in NSR  ASSESSMENT AND PLAN: 43 yo with history of prior peri-partum CMP that recovered, now s/p another pregnancy and delivery, [facility] with headache, dyspnea, and lower extremity edema.  BP has been elevated.  1. H/o peri-partum CMP: Echo yesterday showed normal EF.  Suspect she had diastolic CHF this admission in the setting of poorly controlled HTN.  - Can stop IV Lasix.  Would not send her home on both Lasix and HCTZ so will increase HCTZ to 50 mg daily.  2. HTN: Increase HCTZ to 50 mg daily and labetalol to 300 mg bid.   3. Chest pain: Mild, normal ECG suspect noncardiac.  4. Disposition: Think she can go home today. Followup cardiology 2-3 week.  Cardiac meds: HCTZ 50 daily, labetalol 300 bid, KCl 20 daily.    Marca AnconaDalton Shahab Polhamus 10/17/2013 7:54 AM

## 2013-10-17 NOTE — Plan of Care (Signed)
Problem: Phase I Progression Outcomes Goal: EF % per last Echo/documented,Core Reminder form on chart Outcome: Completed/Met Date Met:  10/17/13 Done. Result in EPIC

## 2013-10-17 NOTE — Discharge Summary (Signed)
Physician Discharge Summary  Tanya Fry NWG:[id number] DOB: 16-May-1971 DOA: 10/14/2013  PCP: Dorrene German, MD  Admit date: 10/14/2013 Discharge date: 10/17/2013  Time spent: > 35 minutes  Recommendations for Outpatient Follow-up:  1. Please be sure to follow up with your Cardiologist in 2-3 weeks or sooner should any new concerns arise. 2. Patient will need to have her potassium levels rechecked  Discharge Diagnoses:  Principal Problem:   Acute exacerbation of CHF (congestive heart failure) Active Problems:   Peripartum cardiomyopathy   Malignant hypertension   Hypokalemia   Discharge Condition: stable  Diet recommendation:  Heart healthy  Filed Weights   10/15/13 0500 10/16/13 0548 10/17/13 0550  Weight: 99.202 kg (218 lb 11.2 oz) 98.2 kg (216 lb 7.9 oz) 96.3 kg (212 lb 4.9 oz)    History of present illness:  43 y/o AAF with post partum cardiomyopathy that presented to the ED complaining of HA and SOB.  Hospital Course:  Acute exacerbation of CHF (congestive heart failure)  - Echocardiogram performed this hospitalization showing EF of 55-60% as such patient had diastolic heart failure - Cardiology has evaluated and made the following recommendations: ASSESSMENT AND PLAN:  43 yo with history of prior peri-partum CMP that recovered, now s/p another pregnancy and delivery, presented to [facility] with headache, dyspnea, and lower extremity edema. BP has been elevated.  1. H/o peri-partum CMP: Echo yesterday showed normal EF. Suspect she had diastolic CHF this admission in the setting of poorly controlled HTN.  - Can stop IV Lasix. Would not send her home on both Lasix and HCTZ so will increase HCTZ to 50 mg daily.  2. HTN: Increase HCTZ to 50 mg daily and labetalol to 300 mg bid.  3. Chest pain: Mild, normal ECG suspect noncardiac.  4. Disposition: Think she can go home today. Followup cardiology 2-3 week. Cardiac meds: HCTZ 50 daily, labetalol 300 bid, KCl 20 daily.   Active  Problems:  Peripartum cardiomyopathy  - Cardiology on board as mentioned above. Patient to followup with cardiology in 2-3 week  Malignant hypertension  - Patient currently on labetalol and hydrochlorothiazide  - Will need testing for secondary causes of hypertension once discharged.  - Will continue to monitor blood pressures   Hypokalemia  - Replace orally  - Magnesium levels WNL  - Plan will be to discharge on potassium supplementation    Procedures:  Echocardiogram as indicated above  Consultations:  Cardiology: Dr. Marca Ancona  Discharge Exam: Filed Vitals:   10/17/13 1436  BP: 144/97  Pulse: 76  Temp: 98.1 F (36.7 C)  Resp: 16    General: Pt in NAD, alert and awake Cardiovascular: RRR, no MRG Respiratory: CTA BL, no wheezes  Discharge Instructions  Discharge Orders   Future Appointments Provider Department Dept Phone   11/08/2013 3:00 PM Minta Balsam, MD [facility] [phone]   Future Orders Complete By Expires   Call MD for:  temperature >100.4  As directed    Diet - low sodium heart healthy  As directed    Discharge instructions  As directed    Comments:     Please be sure to follow up with the Cardiologist in 2-3 weeks or sooner   Increase activity slowly  As directed        Medication List    STOP taking these medications       aspirin 81 MG chewable tablet  Commonly known as:  ASPIRIN CHILDRENS     ibuprofen 600 MG  tablet  Commonly known as:  ADVIL,MOTRIN     metoprolol tartrate 25 MG tablet  Commonly known as:  LOPRESSOR      TAKE these medications       acetaminophen 500 MG tablet  Commonly known as:  TYLENOL  Take 1,000 mg by mouth every 6 (six) hours as needed for mild pain.     hydrochlorothiazide 50 MG tablet  Commonly known as:  HYDRODIURIL  Take 1 tablet (50 mg total) by mouth daily.     labetalol 300 MG tablet  Commonly known as:  NORMODYNE  Take 1 tablet (300 mg total) by mouth 2 (two) times daily.      oxyCODONE-acetaminophen 5-325 MG per tablet  Commonly known as:  ROXICET  Take 1-2 tablets by mouth every 4 (four) hours as needed for severe pain.     potassium chloride SA 20 MEQ tablet  Commonly known as:  K-DUR,KLOR-CON  Take 1 tablet (20 mEq total) by mouth daily.       Allergies  Allergen Reactions  . Shellfish Allergy Anaphylaxis      The results of significant diagnostics from this hospitalization (including imaging, microbiology, ancillary and laboratory) are listed below for reference.    Significant Diagnostic Studies: Dg Chest 2 View  10/14/2013   CLINICAL DATA:  Cough, congestion, short of breath for 5 days. Recently postpartum.  EXAM: CHEST  2 VIEW  COMPARISON:  Prior chest x-ray 01/05/2013  FINDINGS: Diffuse peribronchial cuffing and mild interstitial prominence with linear atelectasis in the right middle lobe. No discrete focal airspace consolidation. No pleural effusion or pneumothorax. Query mild interval enlargement of the cardiopericardial silhouette. No acute osseous abnormality.  IMPRESSION: 1. Central airway thickening/peribronchial cuffing, mild interstitial prominence and linear atelectasis in the right middle lobe. While nonspecific, findings are most suggestive of atypical or viral pneumonia. Query clinical history of influenza? 2. Perhaps mild interval enlargement of the cardiopericardial silhouette. While this may be related to differences in technique, small pericardial effusion or interval cardiac enlargement is not excluded entirely.   Electronically Signed   By: Malachy MoanHeath  McCullough M.D.   On: 10/14/2013 08:21   [place] Ob Follow Up  09/28/2013   OBSTETRICAL ULTRASOUND: This exam was performed within a [place] [facility] Department. The OB US report was generated in the AS system, and faxed to the ordering physician.   This report is also available in [facility] [employer] [employer] and in the [employer] PACS. See AS Obstetric US report.  [place] Fetal Bpp W/o  Non Stress  10/02/2013   OBSTETRICAL ULTRASOUND: This exam was performed within a [place] [facility] Department. The OB US report was generated in the AS system, and faxed to the ordering physician.   This report is also available in [facility] [employer] and in the [employer] PACS. See AS Obstetric US report.   Microbiology: No results found for this or any previous visit (from the past 240 hour(s)).   Labs: Basic Metabolic Panel:  Recent Labs Lab 10/14/13 0800 10/15/13 0559 10/16/13 0554 10/17/13 0600  NA 141 140 139 139  K 3.6* 3.1* 3.3* 3.6*  CL 104 101 100 99  CO2 25 26 25 26   GLUCOSE 84 89 95 99  BUN 19 20 22 21   CREATININE 0.82 0.83 0.84 0.81  CALCIUM 8.3* 8.5 8.4 8.8  MG  --   --  1.9  --    Liver Function Tests:  Recent Labs Lab 10/14/13 0800  AST 12  ALT 17  ALKPHOS  87  BILITOT <0.2*  PROT 6.5  ALBUMIN 2.8*   No results found for this basename: LIPASE, AMYLASE,  in the last 168 hours No results found for this basename: AMMONIA,  in the last 168 hours CBC:  Recent Labs Lab 10/14/13 0800  WBC 6.2  NEUTROABS 4.0  HGB 10.2*  HCT 30.9*  MCV 90.9  PLT 340   Cardiac Enzymes:  Recent Labs Lab 10/14/13 0800 10/17/13 0650 10/17/13 1336  TROPONINI <0.30 <0.30 <0.30   BNP: BNP (last 3 results)  Recent Labs  10/14/13 0800  PROBNP 2377.0*   CBG: No results found for this basename: GLUCAP,  in the last 168 hours     Signed:  Penny Pia  [facility] 10/17/2013, 4:07 PM

## 2013-10-18 NOTE — Progress Notes (Signed)
NST reviewed and reactive.  

## 2013-11-01 ENCOUNTER — Encounter: Payer: Self-pay | Admitting: *Deleted

## 2013-11-08 ENCOUNTER — Ambulatory Visit (INDEPENDENT_AMBULATORY_CARE_PROVIDER_SITE_OTHER): Payer: Medicaid Other | Admitting: Family Medicine

## 2013-11-08 DIAGNOSIS — IMO0001 Reserved for inherently not codable concepts without codable children: Secondary | ICD-10-CM

## 2013-11-08 DIAGNOSIS — O10019 Pre-existing essential hypertension complicating pregnancy, unspecified trimester: Secondary | ICD-10-CM

## 2013-11-08 NOTE — Progress Notes (Signed)
Patient ID: Tanya Fry, female   DOB: 04/14/1971, 43 y.o.   MRN: [id number] Subjective:     Tanya LouisLisa Y Branagan is a 43 y.o. female who presents for a postpartum visit. She is 5 weeks postpartum following a spontaneous vaginal delivery. I have fully reviewed the prenatal and intrapartum course. The delivery was at 39 gestational weeks. Outcome: spontaneous vaginal delivery. Anesthesia: epidural. Postpartum course was [facility] pressure. Started on labetalol and HCTZ to 50mg , BP is being managed Advare (FP doctor). Baby's course has been None. Baby is feeding by breast. Bleeding no bleeding. Bowel function is normal. Bladder function is normal. Patient is not sexually active. Contraception method is tubal ligation. Postpartum depression screening: negative.  The following portions of the patient's history were reviewed and updated as appropriate: allergies, current medications, past family history, past medical history, past social history, past surgical history and problem list.  Review of Systems Pertinent items are noted in HPI.   Objective:    BP 132/91  Pulse 74  Temp(Src) 97.5 F (36.4 C)  Wt 98.748 kg (217 lb 11.2 oz)  Breastfeeding? Yes  General:  alert, cooperative, appears stated age and no distress   Breasts:  declined  Lungs: clear to auscultation bilaterally and normal percussion bilaterally  Heart:  regular rate and rhythm, S1, S2 normal, no murmur, click, rub or gallop  Abdomen: soft, non-tender; bowel sounds normal; no masses,  no organomegaly   Vulva:  not evaluated  Vagina: not evaluated  Cervix:    Corpus: not examined  Adnexa:  not evaluated  Rectal Exam: Not performed.        Assessment:     5 postpartum exam. Pap smear not done at today's visit.   Plan:    1. Contraception: tubal ligation 2. Ok to return to intercourse 3. Follow up in: as needed. \ 4. F/u w/ PCM in 2 weeks for control of malignant HTN

## 2013-11-08 NOTE — Patient Instructions (Signed)

## 2013-11-24 ENCOUNTER — Encounter: Payer: Self-pay | Admitting: *Deleted

## 2013-11-29 ENCOUNTER — Encounter: Payer: Self-pay | Admitting: *Deleted

## 2014-03-23 ENCOUNTER — Other Ambulatory Visit: Payer: Self-pay

## 2014-03-23 DIAGNOSIS — Z1231 Encounter for screening mammogram for malignant neoplasm of breast: Secondary | ICD-10-CM

## 2014-06-08 ENCOUNTER — Encounter: Payer: Self-pay | Admitting: General Practice

## 2014-08-13 ENCOUNTER — Encounter (HOSPITAL_COMMUNITY): Payer: Self-pay | Admitting: *Deleted

## 2014-09-19 ENCOUNTER — Ambulatory Visit: Payer: Medicaid Other

## 2014-09-28 ENCOUNTER — Ambulatory Visit: Payer: Medicaid Other

## 2014-10-25 IMAGING — US US OB FOLLOW-UP
1 series · 12 of 28 positions shown · non-contrast
Comparison: none

[Series 1: us ob follow up · 12 of 32 slices shown]
[im 2/32]
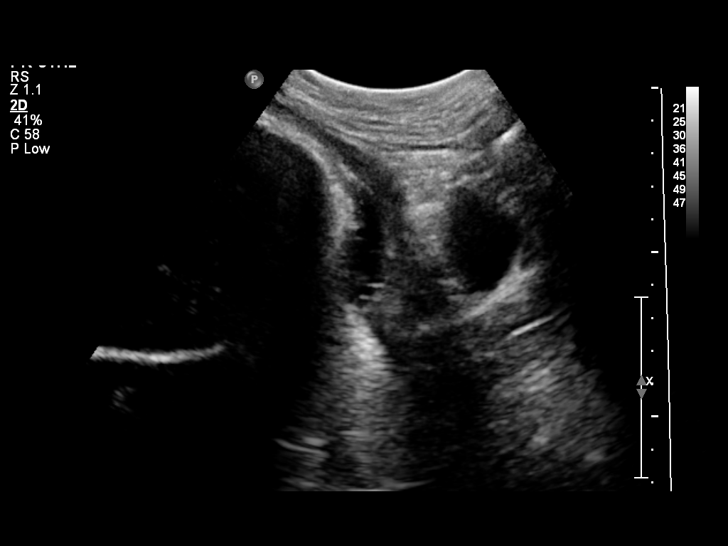
[im 4/32]
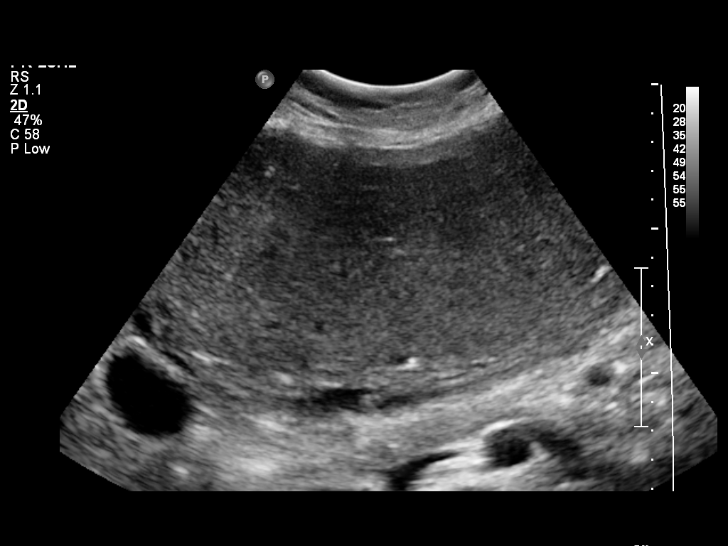
[im 6/32]
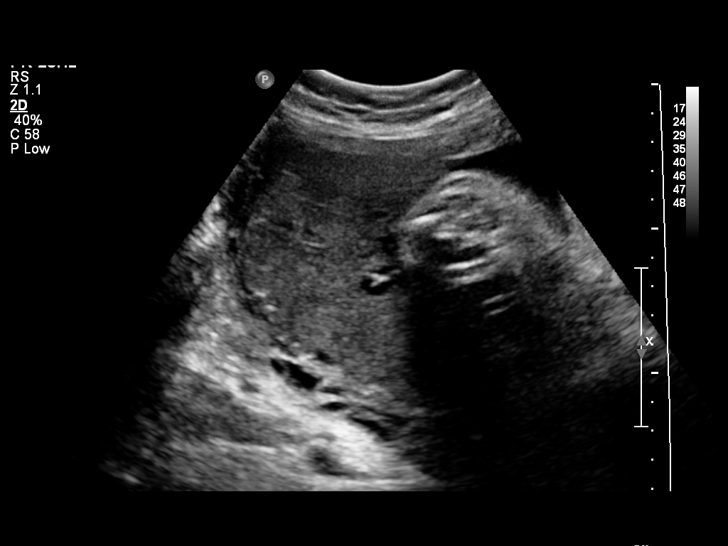
[im 10/32]
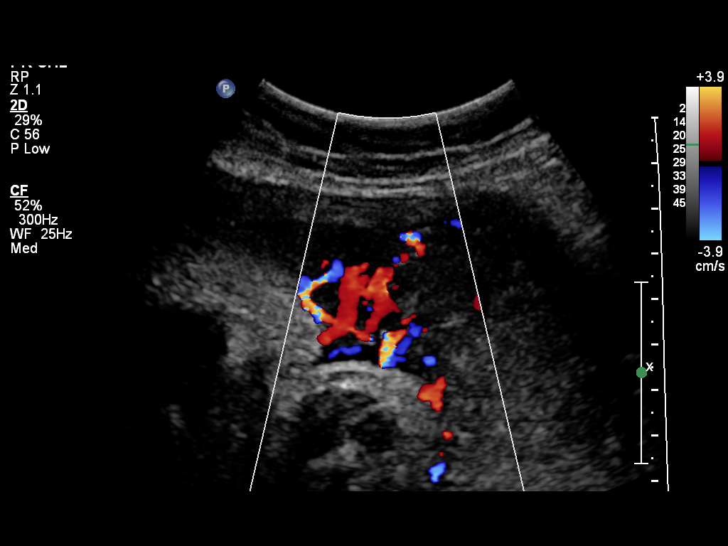
[im 12/32]
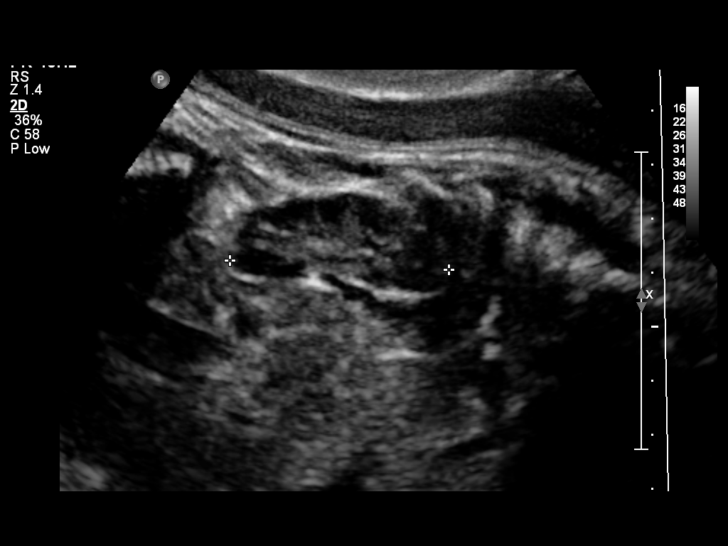
[im 14/32]
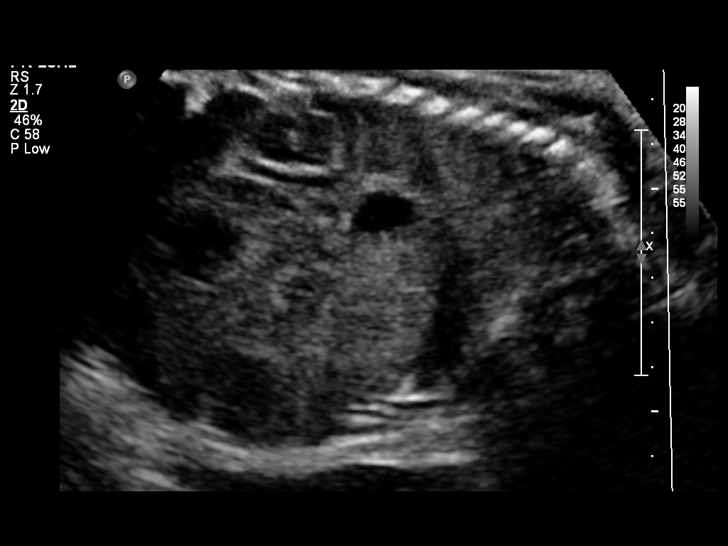
[im 18/32]
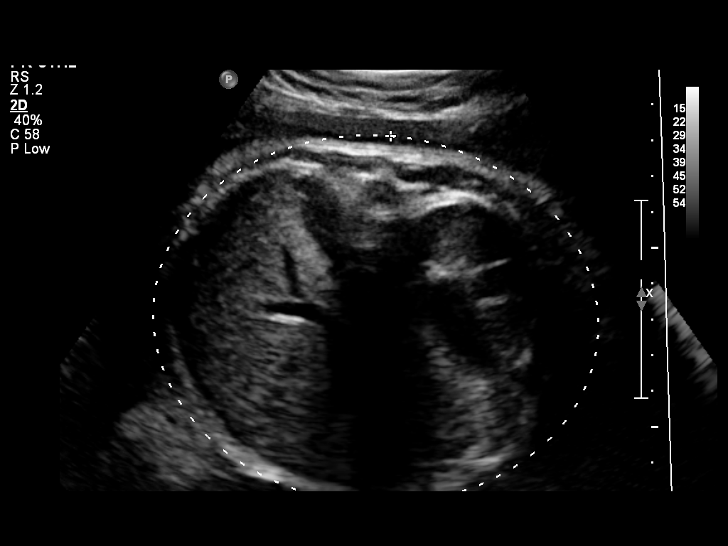
[im 20/32]
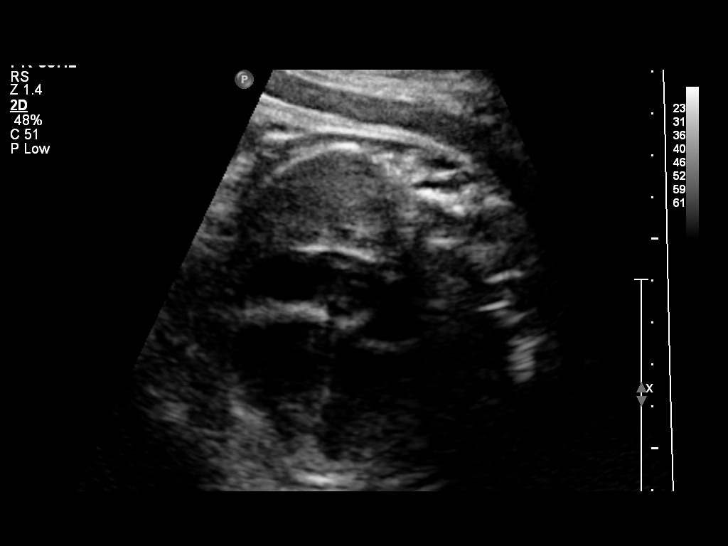
[im 22/32]
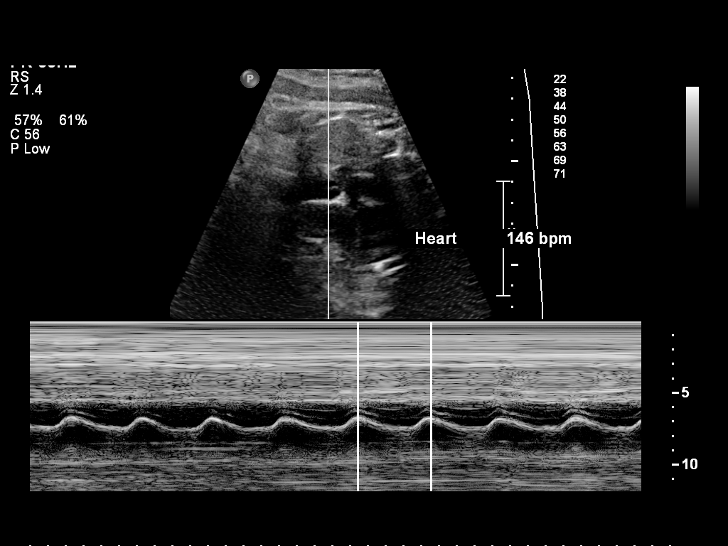
[im 26/32]
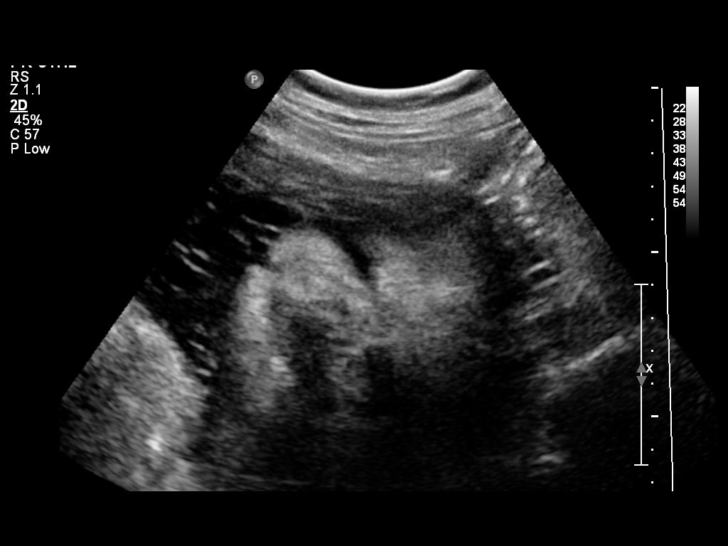
[im 28/32]
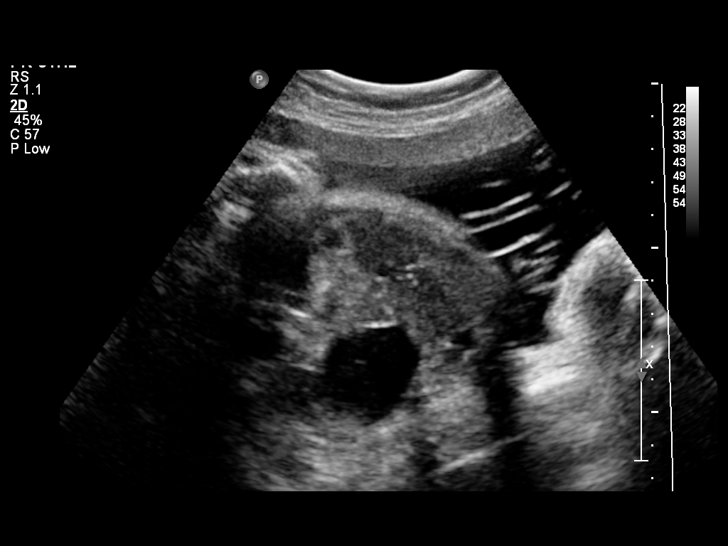
[im 30/32]
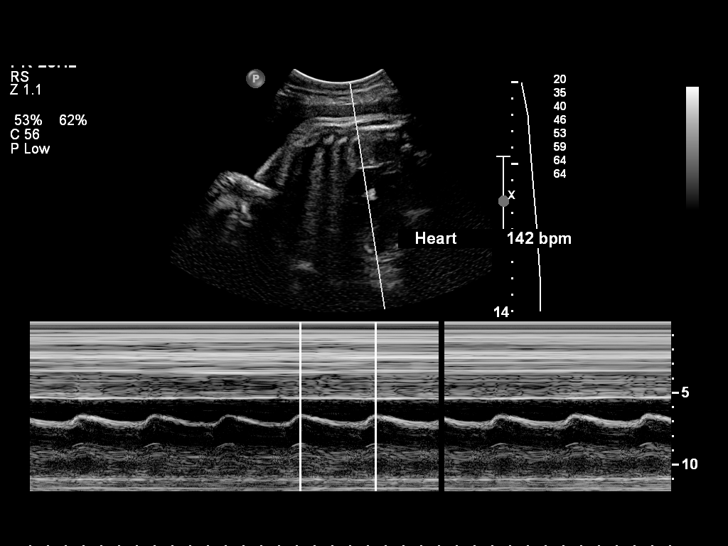

[12 of 28 positions shown; findings below may reference images not displayed]

OBSTETRICS REPORT
                      (Signed Final 09/28/2013 [DATE])

Service(s) Provided

 US OB FOLLOW UP                                       76816.1
Indications

 Hypertension - Chronic/Pre-existing
 Poor obstetric history: Previous preterm delivery
 Maternal morbid obesity
 No or Little Prenatal Care
 Cervical insufficiency (Cerclage prior 2
 pregnancies)
Fetal Evaluation

 Num Of Fetuses:    1
 Fetal Heart Rate:  146                          bpm
 Cardiac Activity:  Observed
 Presentation:      Cephalic
 Placenta:          Fundal, above cervical os
 P. Cord            Previously Visualized
 Insertion:

 Amniotic Fluid
 AFI FV:      Subjectively within normal limits
 AFI Sum:     12.13   cm       43  %Tile     Larg Pckt:    4.71  cm
 RUQ:   4.71    cm   RLQ:    3.72   cm    LLQ:   3.7     cm
Biometry

 BPD:     86.9  mm     G. Age:  35w 0d                CI:        70.78   70 - 86
                                                      FL/HC:      22.4   20.9 -

 HC:     329.2  mm     G. Age:  37w 3d       16  %    HC/AC:      0.92   0.92 -

 AC:     356.9  mm     G. Age:  39w 4d       92  %    FL/BPD:     84.9   71 - 87
 FL:      73.8  mm     G. Age:  37w 6d       41  %    FL/AC:      20.7   20 - 24
 HUM:     63.4  mm     G. Age:  36w 6d       48  %

 Est. FW:    1268  gm    7 lb 10 oz      81  %
Gestational Age

 LMP:           38w 2d        Date:  01/03/13                 EDD:   10/10/13
 U/S Today:     37w 3d                                        EDD:   10/16/13
 Best:          38w 2d     Det. By:  LMP  (01/03/13)          EDD:   10/10/13
Anatomy

 Cranium:          Appears normal         Aortic Arch:      Not well visualized
 Fetal Cavum:      Appears normal         Ductal Arch:      Not well visualized
 Ventricles:       Previously seen        Diaphragm:        Appears normal
 Choroid Plexus:   Previously seen        Stomach:          Appears normal, left
                                                            sided
 Cerebellum:       Previously seen        Abdomen:          Appears normal
 Posterior Fossa:  Previously seen        Abdominal Wall:   Previously seen
 Nuchal Fold:      Not applicable (>20    Cord Vessels:     Previously seen
                   wks GA)
 Face:             Profile previously     Kidneys:          Appear normal
                   seen
 Lips:             Previously seen        Bladder:          Appears normal
 Heart:            Appears normal         Spine:            Previously seen
                   (4CH, axis, and
                   situs)
 RVOT:             Previously seen        Lower             Previously seen
                                          Extremities:
 LVOT:             Previously seen        Upper             Previously seen
                                          Extremities:

 Other:  Fetus appears to be a female. Technically difficult due to advanced
         GA.
Cervix Uterus Adnexa

 Cervical Length:    5.05     cm

 Cervix:       Normal appearance by transabdominal scan.
 Uterus:       No abnormality visualized.
 Cul De Sac:   No free fluid seen.

 Adnexa:     No abnormality visualized.
Impression

 Active SIUP at 46w4d in gestation complicated by HTN and
 obesity
 EFW 81st percentile
 AFI is gestational age appropriate
 No structural defects seen noting limitations to evalaution as
 documented above
Recommendations

 Continue NST 2x/wk until delivery, which is recommended at
 39 weeks in setting of uncomplicated HTN.

 questions or concerns.

## 2014-11-10 IMAGING — CR DG CHEST 2V
2 series · 2 of 2 positions shown · non-contrast
Comparison: Prior chest x-ray 01/05/2013

CLINICAL DATA: Cough, congestion, short of breath for 5 days.
Recently postpartum.

EXAM:
CHEST  2 VIEW

[w chest pa]
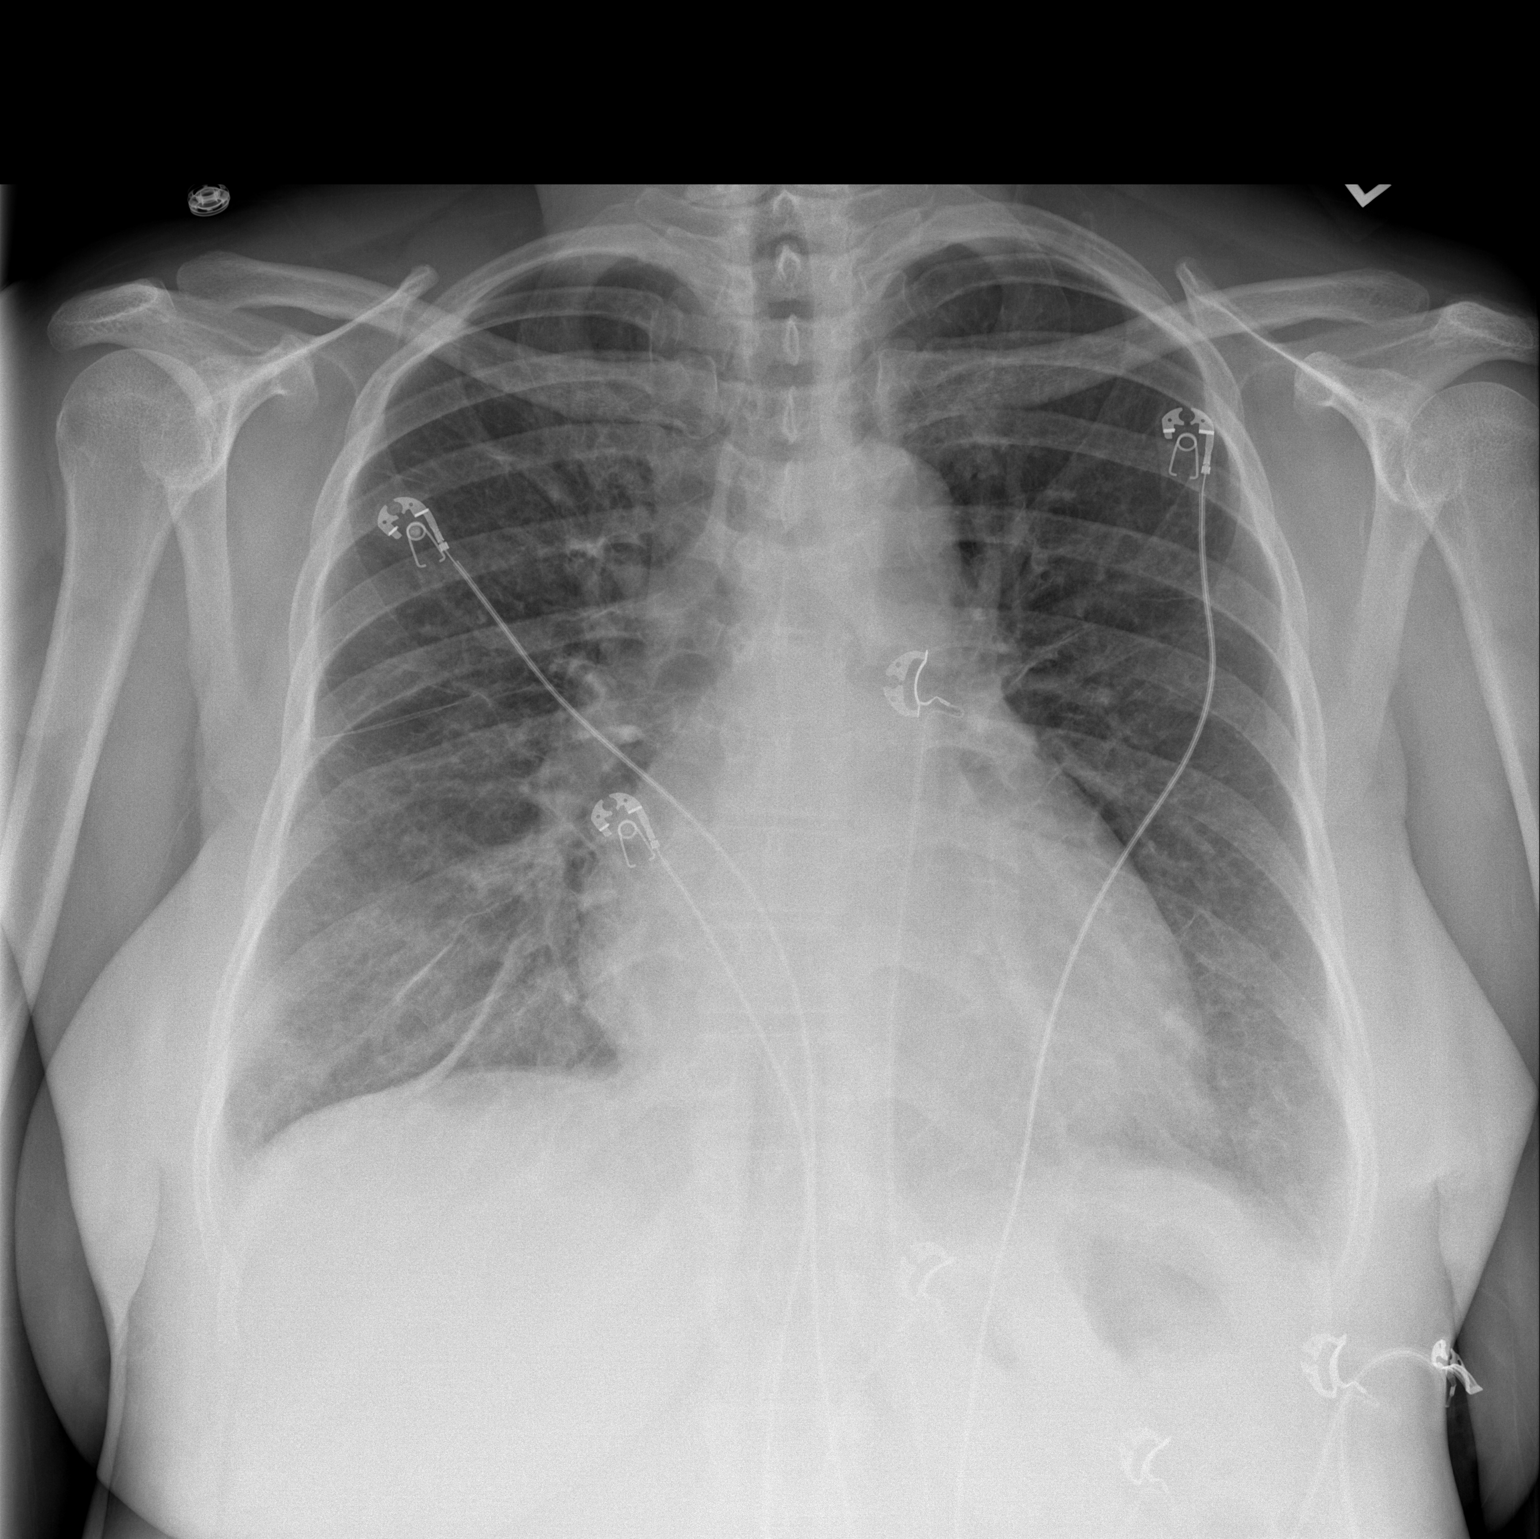

[w chest lat]
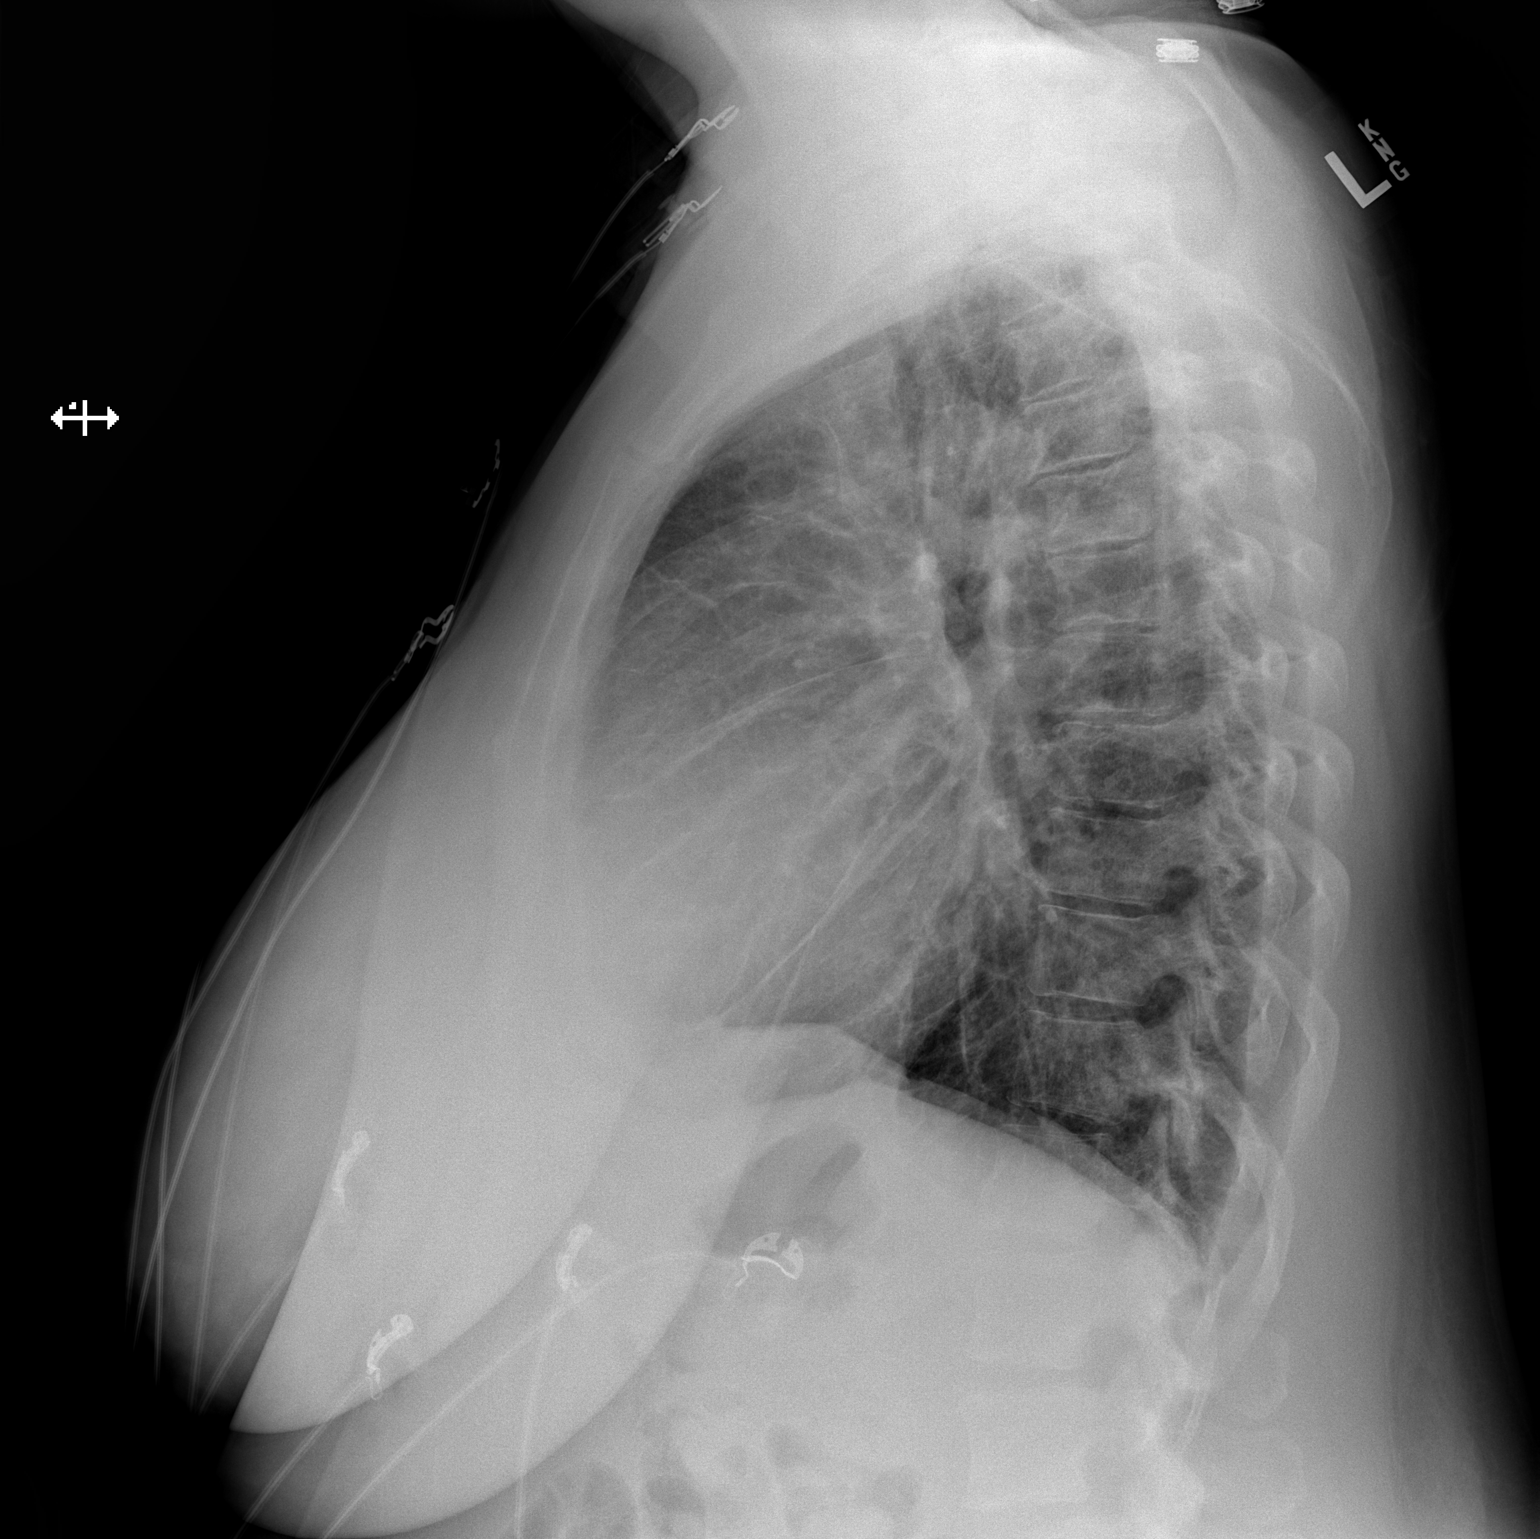

[2 of 2 positions shown; findings below may reference images not displayed]

FINDINGS: Diffuse peribronchial cuffing and mild interstitial prominence with
linear atelectasis in the right middle lobe. No discrete focal
airspace consolidation. No pleural effusion or pneumothorax. Query
mild interval enlargement of the cardiopericardial silhouette. No
acute osseous abnormality.
IMPRESSION: 1. Central airway thickening/peribronchial cuffing, mild
interstitial prominence and linear atelectasis in the right middle
lobe. While nonspecific, findings are most suggestive of atypical or
viral pneumonia. Query clinical history of influenza?
2. Perhaps mild interval enlargement of the cardiopericardial
silhouette. While this may be related to differences in technique,
small pericardial effusion or interval cardiac enlargement is not
excluded entirely.

## 2015-11-13 ENCOUNTER — Encounter (HOSPITAL_COMMUNITY): Payer: Self-pay | Admitting: Emergency Medicine

## 2015-11-13 ENCOUNTER — Emergency Department (HOSPITAL_COMMUNITY)
Admission: EM | Admit: 2015-11-13 | Discharge: 2015-11-13 | Disposition: A | Discharge status: Home / Self Care | Payer: Medicaid Other | Attending: Emergency Medicine | Admitting: Emergency Medicine

## 2015-11-13 DIAGNOSIS — R112 Nausea with vomiting, unspecified: Secondary | ICD-10-CM | POA: Diagnosis not present

## 2015-11-13 DIAGNOSIS — R61 Generalized hyperhidrosis: Secondary | ICD-10-CM | POA: Insufficient documentation

## 2015-11-13 DIAGNOSIS — Z79899 Other long term (current) drug therapy: Secondary | ICD-10-CM | POA: Insufficient documentation

## 2015-11-13 DIAGNOSIS — I10 Essential (primary) hypertension: Secondary | ICD-10-CM | POA: Diagnosis not present

## 2015-11-13 DIAGNOSIS — Z862 Personal history of diseases of the blood and blood-forming organs and certain disorders involving the immune mechanism: Secondary | ICD-10-CM | POA: Insufficient documentation

## 2015-11-13 DIAGNOSIS — R002 Palpitations: Secondary | ICD-10-CM

## 2015-11-13 DIAGNOSIS — R109 Unspecified abdominal pain: Secondary | ICD-10-CM | POA: Diagnosis not present

## 2015-11-13 DIAGNOSIS — R531 Weakness: Secondary | ICD-10-CM | POA: Diagnosis not present

## 2015-11-13 DIAGNOSIS — R42 Dizziness and giddiness: Secondary | ICD-10-CM | POA: Diagnosis not present

## 2015-11-13 DIAGNOSIS — F17210 Nicotine dependence, cigarettes, uncomplicated: Secondary | ICD-10-CM | POA: Insufficient documentation

## 2015-11-13 LAB — CBC WITH DIFFERENTIAL/PLATELET
BASOS ABS: 0 10*3/uL (ref 0.0–0.1)
BASOS PCT: 0 %
EOS PCT: 0 %
Eosinophils Absolute: 0 10*3/uL (ref 0.0–0.7)
HCT: 41.1 % (ref 36.0–46.0)
Hemoglobin: 14 g/dL (ref 12.0–15.0)
Lymphocytes Relative: 19 %
Lymphs Abs: 1.7 10*3/uL (ref 0.7–4.0)
MCH: 31.5 pg (ref 26.0–34.0)
MCHC: 34.1 g/dL (ref 30.0–36.0)
MCV: 92.4 fL (ref 78.0–100.0)
Monocytes Absolute: 0.2 10*3/uL (ref 0.1–1.0)
Monocytes Relative: 2 %
Neutro Abs: 6.9 10*3/uL (ref 1.7–7.7)
Neutrophils Relative %: 79 %
Platelets: 261 10*3/uL (ref 150–400)
RBC: 4.45 MIL/uL (ref 3.87–5.11)
RDW: 13.1 % (ref 11.5–15.5)
WBC: 8.8 10*3/uL (ref 4.0–10.5)

## 2015-11-13 LAB — I-STAT CHEM 8, ED
BUN: 14 mg/dL (ref 6–20)
CALCIUM ION: 1.13 mmol/L (ref 1.12–1.23)
CHLORIDE: 100 mmol/L — AB (ref 101–111)
Creatinine, Ser: 0.9 mg/dL (ref 0.44–1.00)
GLUCOSE: 121 mg/dL — AB (ref 65–99)
HCT: 44 % (ref 36.0–46.0)
HEMOGLOBIN: 15 g/dL (ref 12.0–15.0)
Potassium: 3.4 mmol/L — ABNORMAL LOW (ref 3.5–5.1)
SODIUM: 140 mmol/L (ref 135–145)
TCO2: 27 mmol/L (ref 0–100)

## 2015-11-13 LAB — I-STAT TROPONIN, ED: TROPONIN I, POC: 0.03 ng/mL (ref 0.00–0.08)

## 2015-11-13 MED ORDER — SODIUM CHLORIDE 0.9 % IV BOLUS (SEPSIS)
1000.0000 mL | Freq: Once | INTRAVENOUS | Status: AC
  Administered 2015-11-13: 1000 mL via INTRAVENOUS

## 2015-11-13 NOTE — Discharge Instructions (Signed)
Palpitations A palpitation is the feeling that your heartbeat is irregular or is faster than normal. It may feel like your heart is fluttering or skipping a beat. Palpitations are usually not a serious problem. However, in some cases, you may need further medical evaluation. CAUSES  Palpitations can be caused by:  Smoking.  Caffeine or other stimulants, such as diet pills or energy drinks.  Alcohol.  Stress and anxiety.  Strenuous physical activity.  Fatigue.  Certain medicines.  Heart disease, especially if you have a history of irregular heart rhythms (arrhythmias), such as atrial fibrillation, atrial flutter, or supraventricular tachycardia.  An improperly working pacemaker or defibrillator. DIAGNOSIS  To find the cause of your palpitations, your health care provider will take your medical history and perform a physical exam. Your health care provider may also have you take a test called an ambulatory electrocardiogram (ECG). An ECG records your heartbeat patterns over a 24-hour period. You may also have other tests, such as:  Transthoracic echocardiogram (TTE). During echocardiography, sound waves are used to evaluate how blood flows through your heart.  Transesophageal echocardiogram (TEE).  Cardiac monitoring. This allows your health care provider to monitor your heart rate and rhythm in real time.  Holter monitor. This is a portable device that records your heartbeat and can help diagnose heart arrhythmias. It allows your health care provider to track your heart activity for several days, if needed.  Stress tests by exercise or by giving medicine that makes the heart beat faster. TREATMENT  Treatment of palpitations depends on the cause of your symptoms and can vary greatly. Most cases of palpitations do not require any treatment other than time, relaxation, and monitoring your symptoms. Other causes, such as atrial fibrillation, atrial flutter, or supraventricular  tachycardia, usually require further treatment. HOME CARE INSTRUCTIONS   Avoid:  Caffeinated coffee, tea, soft drinks, diet pills, and energy drinks.  Chocolate.  Alcohol.  Stop smoking if you smoke.  Reduce your stress and anxiety. Things that can help you relax include:  A method of controlling things in your body, such as your heartbeats, with your mind (biofeedback).  Yoga.  Meditation.  Physical activity such as swimming, jogging, or walking.  Get plenty of rest and sleep. SEEK MEDICAL CARE IF:   You continue to have a fast or irregular heartbeat beyond 24 hours.  Your palpitations occur more often. SEEK IMMEDIATE MEDICAL CARE IF:  You have chest pain or shortness of breath.  You have a severe headache.  You feel dizzy or you faint. MAKE SURE YOU:  Understand these instructions.  Will watch your condition.  Will get help right away if you are not doing well or get worse.   This information is not intended to replace advice given to you by your health care provider. Make sure you discuss any questions you have with your health care provider.   Document Released: 09/25/2000 Document Revised: 10/03/2013 Document Reviewed: 11/27/2011 Elsevier Interactive Patient Education [employer] Is also recommended that you follow up with a GI specialist.  I would discuss this with Dr. Concepcion Elk,  He can then refer you to the gastroenterologist of his choice.  Please return anytime you develop recurrent symptoms

## 2015-11-13 NOTE — ED Provider Notes (Signed)
CSN: [id number]     Arrival date & time 11/13/15  2117 History   First MD Initiated Contact with Patient 11/13/15 2127     Chief Complaint  Patient presents with  . Weakness  . Emesis  . Abdominal Pain     (Consider location/radiation/quality/duration/timing/severity/associated sxs/prior Treatment) HPI Comments: Patient states that she had a episode of palpitations associated with diaphoresis, nausea and dizziness that lasted approximately 30 minutes, resolved after she vomited.  She states this is the third episode that she's had like this last 2 have been investigated with no contributing factors identified. At this time.  She states she feels better.  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 female presenting with weakness, vomiting, and abdominal pain. The history is provided by the patient.  Weakness This is a recurrent problem. The current episode started today. The problem occurs rarely. The problem has been resolved. Associated symptoms include diaphoresis, nausea, vomiting and weakness. Pertinent negatives include no abdominal pain, chest pain, chills, coughing, fever or rash. Nothing aggravates the symptoms. She has tried nothing for the symptoms. The treatment provided no relief.  Emesis Associated symptoms: no abdominal pain, no chills and no diarrhea   Abdominal Pain Associated symptoms: nausea and vomiting   Associated symptoms: no chest pain, no chills, no constipation, no cough, no diarrhea and no fever     Past Medical History  Diagnosis Date  . Hypertension     CHTN, no current meds  . Anemia   . Postpartum cardiomyopathy    Past Surgical History  Procedure Laterality Date  . No past surgeries    . Cervical cerclage    . Tubal ligation Bilateral 10/03/2013    Procedure: POST PARTUM TUBAL LIGATION;  Surgeon: Tereso Newcomer, MD;  Location: [facility];   Service: Gynecology;  Laterality: Bilateral;   Family History  Problem Relation Age of Onset  . Cancer Maternal Grandmother     breast cancer  . Cancer Paternal Grandfather     prostate  . Heart failure Father 87   Social History  Substance Use Topics  . Smoking status: Current Every Day Smoker -- 0.50 packs/day    Types: Cigarettes    Last Attempt to Quit: 12/28/2012  . Smokeless tobacco: Never Used  . Alcohol Use: Yes   OB History    Gravida Para Term Preterm AB TAB SAB Ectopic Multiple Living   Review of Systems  Constitutional: Positive for diaphoresis. Negative for fever and chills.  Respiratory: Negative for cough and chest tightness.   Cardiovascular: Negative for chest pain and leg swelling.  Gastrointestinal: Positive for nausea and vomiting. Negative for abdominal pain, diarrhea and constipation.  Skin: Negative for rash.  Neurological: Positive for dizziness and weakness.  All other systems reviewed and are negative.     Allergies  Shellfish allergy  Home Medications   Prior to Admission medications   Medication Sig Start Date End Date Taking? Authorizing Provider  acetaminophen (TYLENOL) 500 MG tablet Take 1,000 mg by mouth every 6 (six) hours as needed for mild pain.    Yes Historical Provider, MD  hydrochlorothiazide (HYDRODIURIL) 50 MG tablet Take 1 tablet (50 mg total) by mouth daily. 10/17/13  Yes Penny Pia, MD  labetalol (NORMODYNE) 300 MG tablet Take 1 tablet (300 mg total) by mouth  2 (two) times daily. Patient not taking: Reported on 11/13/2015 10/17/13   Penny Pia, MD  oxyCODONE-acetaminophen (ROXICET) 5-325 MG per tablet Take 1-2 tablets by mouth every 4 (four) hours as needed for severe pain. Patient not taking: Reported on 11/13/2015 10/10/13   Armando Reichert, CNM  potassium chloride SA (K-DUR,KLOR-CON) 20 MEQ tablet Take 1 tablet (20 mEq total) by mouth daily. Patient not taking: Reported on 11/13/2015 10/17/13   Penny Pia,  MD   BP 105/76 mmHg  Pulse 100  Temp(Src) 97.5 F (36.4 C) (Oral)  Resp 19  Ht  (1.702 m)  Wt 108.863 kg  BMI 37.58 kg/m2  SpO2 100%  LMP 11/06/2015  Breastfeeding? No Physical Exam  Constitutional: She is oriented to person, place, and time. She appears well-developed and well-nourished.  HENT:  Head: Normocephalic.  Eyes: Pupils are equal, round, and reactive to light.  Neck: Normal range of motion.  Cardiovascular: Normal rate and regular rhythm.   Pulmonary/Chest: Effort normal and breath sounds normal.  Abdominal: Soft.  Musculoskeletal: Normal range of motion. She exhibits no edema or tenderness.  Neurological: She is alert and oriented to person, place, and time.  Skin: Skin is warm and dry.    ED Course  Procedures (including critical care time) Labs Review Labs Reviewed  I-STAT CHEM 8, ED - Abnormal; Notable for the following:    Potassium 3.4 (*)    Chloride 100 (*)    Glucose, Bld 121 (*)    All other components within normal limits  CBC WITH DIFFERENTIAL/PLATELET  I-STAT TROPOININ, ED    Imaging Review No results found. I have personally reviewed and evaluated these images and lab results as part of my medical decision-making.   EKG Interpretation None    .  She has been inserted in the emergency room for several hours.  She's had no recurrent symptoms.  She states she feels back at her baseline EKG, cardiac markers labs are all within normal parameters.  It is recommended that she follow-up with her primary care physician, as well as see a gastroenterologist for  gastric outlet evaluation  MDM   Final diagnoses:  Palpitation         Earley Favor, NP 11/13/15 2251  Earley Favor, NP 11/13/15 2251  Nelva Nay, MD 11/13/15 2253

## 2015-11-13 NOTE — ED Notes (Signed)
Pt here from home with generalized weakness and sudden onset abdominal pain with one episode of emesis and dizziness. Pt reports she feels better since emesis.

## 2015-11-13 NOTE — ED Notes (Signed)
Pt ambulates independently and with steady gait at time of discharge. Discharge instructions and follow up information reviewed with patient. No other questions or concerns voiced at this time.  

## 2016-02-19 DIAGNOSIS — I10 Essential (primary) hypertension: Secondary | ICD-10-CM | POA: Insufficient documentation

## 2016-03-19 ENCOUNTER — Encounter (HOSPITAL_COMMUNITY): Payer: Self-pay | Admitting: Emergency Medicine

## 2016-03-19 ENCOUNTER — Emergency Department (HOSPITAL_COMMUNITY): Payer: Medicaid Other

## 2016-03-19 ENCOUNTER — Emergency Department (HOSPITAL_COMMUNITY)
Admission: EM | Admit: 2016-03-19 | Discharge: 2016-03-19 | Disposition: A | Discharge status: Home / Self Care | Payer: Medicaid Other | Attending: Emergency Medicine | Admitting: Emergency Medicine

## 2016-03-19 DIAGNOSIS — Z79899 Other long term (current) drug therapy: Secondary | ICD-10-CM | POA: Diagnosis not present

## 2016-03-19 DIAGNOSIS — G8929 Other chronic pain: Secondary | ICD-10-CM | POA: Diagnosis not present

## 2016-03-19 DIAGNOSIS — I10 Essential (primary) hypertension: Secondary | ICD-10-CM | POA: Diagnosis not present

## 2016-03-19 DIAGNOSIS — F17210 Nicotine dependence, cigarettes, uncomplicated: Secondary | ICD-10-CM | POA: Insufficient documentation

## 2016-03-19 DIAGNOSIS — M25512 Pain in left shoulder: Secondary | ICD-10-CM | POA: Diagnosis not present

## 2016-03-19 DIAGNOSIS — M898X1 Other specified disorders of bone, shoulder: Secondary | ICD-10-CM

## 2016-03-19 MED ORDER — KETOROLAC TROMETHAMINE 15 MG/ML IJ SOLN
15.0000 mg | Freq: Once | INTRAMUSCULAR | Status: AC
  Administered 2016-03-19: 15 mg via INTRAMUSCULAR
  Filled 2016-03-19: qty 1

## 2016-03-19 NOTE — ED Notes (Signed)
Patient here from home with complaints of back pain radiating down into left arm x3 days. Hurts more upon movement.

## 2016-03-19 NOTE — ED Notes (Signed)
PT DISCHARGED. INSTRUCTIONS GIVEN. AAOX4. PT IN NO APPARENT DISTRESS. THE OPPORTUNITY TO ASK QUESTIONS WAS PROVIDED. 

## 2016-04-03 NOTE — ED Provider Notes (Signed)
CSN: [id number]     Arrival date & time 03/19/16  [id number] History   First MD Initiated Contact with Patient 03/19/16 1018     Chief Complaint  Patient presents with  . Back Pain     (Consider location/radiation/quality/duration/timing/severity/associated sxs/prior Treatment) HPI   45 year old female with upper back pain. Gradual onset about 3 days ago. Denies any trauma, overuse or strain. Pain worse with movement. Also getting some pain to left shoulder/upper extremity. No numbness or tingling. Denies any neck pain. Denies any other associated symptoms such as dyspnea, palpitations, nausea or diaphoresis. Has not tried taking anything for her symptoms.  Past Medical History  Diagnosis Date  . Hypertension     CHTN, no current meds  . Anemia   . Postpartum cardiomyopathy    Past Surgical History  Procedure Laterality Date  . No past surgeries    . Cervical cerclage    . Tubal ligation Bilateral 10/03/2013    Procedure: POST PARTUM TUBAL LIGATION;  Surgeon: Tereso NewcomerUgonna A Anyanwu, MD;  Location: [facility];  Service: Gynecology;  Laterality: Bilateral;   Family History  Problem Relation Age of Onset  . Cancer Maternal Grandmother     breast cancer  . Cancer Paternal Grandfather     prostate  . Heart failure Father 1850   Social History  Substance Use Topics  . Smoking status: Current Every Day Smoker -- 0.50 packs/day    Types: Cigarettes    Last Attempt to Quit: 12/28/2012  . Smokeless tobacco: Never Used  . Alcohol Use: Yes   OB History    Gravida Para Term Preterm AB TAB SAB Ectopic Multiple Living   8 4 3 1 4  4   4      Review of Systems  All systems reviewed and negative, other than as noted in HPI.   Allergies  Shellfish allergy  Home Medications   Prior to Admission medications   Medication Sig Start Date End Date Taking? Authorizing Provider  acetaminophen (TYLENOL) 500 MG tablet Take 1,000 mg by mouth every 6 (six) hours as needed for mild pain.    Yes Historical  Provider, MD  Cholecalciferol (VITAMIN D3) 2000 units capsule Take 1 capsule by mouth daily.   Yes Historical Provider, MD  hydrochlorothiazide (HYDRODIURIL) 50 MG tablet Take 1 tablet (50 mg total) by mouth daily. 10/17/13  Yes Penny Piarlando Vega, MD  naproxen sodium (ANAPROX) 220 MG tablet Take 440 mg by mouth 2 (two) times daily as needed (pain).   Yes Historical Provider, MD  potassium chloride SA (K-DUR,KLOR-CON) 20 MEQ tablet Take 1 tablet (20 mEq total) by mouth daily. Patient taking differently: Take 20 mEq by mouth 3 (three) times a week.  10/17/13  Yes Penny Piarlando Vega, MD  labetalol (NORMODYNE) 300 MG tablet Take 1 tablet (300 mg total) by mouth 2 (two) times daily. Patient not taking: Reported on 11/13/2015 10/17/13   Penny Piarlando Vega, MD   BP 117/74 mmHg  Pulse 77  Temp(Src) 98.1 F (36.7 C) (Oral)  Resp 16  SpO2 100%  LMP 03/15/2016 (Exact Date) Physical Exam  Constitutional: She appears well-developed and well-nourished. No distress.  HENT:  Head: Normocephalic and atraumatic.  Eyes: Conjunctivae are normal. Right eye exhibits no discharge. Left eye exhibits no discharge.  Neck: Neck supple.  Cardiovascular: Normal rate, regular rhythm and normal heart sounds.  Exam reveals no gallop and no friction rub.   No murmur heard. Pulmonary/Chest: Effort normal and breath sounds normal. No respiratory distress. She exhibits no  tenderness.  Abdominal: Soft. She exhibits no distension. There is no tenderness.  Musculoskeletal: She exhibits no edema or tenderness.  Neurological: She is alert.  Skin: Skin is warm and dry.  Psychiatric: She has a normal mood and affect. Her behavior is normal. Thought content normal.  Nursing note and vitals reviewed.   ED Course  Procedures (including critical care time) Labs Review Labs Reviewed - No data to display  Imaging Review No results found.   Dg Chest 2 View  03/19/2016  CLINICAL DATA:  Left lateral chest pain for 3 days EXAM: CHEST  2 VIEW  COMPARISON:  10/14/2013 FINDINGS: The heart size and mediastinal contours are within normal limits. Both lungs are clear. The visualized skeletal structures are unremarkable. IMPRESSION: No active cardiopulmonary disease. Electronically Signed   By: Esperanza Heiraymond  Rubner M.D.   On: 03/19/2016 11:52   I have personally reviewed and evaluated these images and lab results as part of my medical decision-making.   EKG Interpretation None      MDM   Final diagnoses:  Chronic scapular pain    Recent-year-old female with left upper back pain. Very consistent with muscular skeletal etiology. Very low suspicion for ACS, PE, dissection or other emergent process. Plan symptomatic treatment.   Raeford RazorStephen Marilu Rylander, MD 04/03/16 2044

## 2016-06-30 ENCOUNTER — Other Ambulatory Visit: Payer: Self-pay | Admitting: Internal Medicine

## 2016-06-30 DIAGNOSIS — Z1231 Encounter for screening mammogram for malignant neoplasm of breast: Secondary | ICD-10-CM

## 2016-07-09 ENCOUNTER — Ambulatory Visit
Admission: RE | Admit: 2016-07-09 | Discharge: 2016-07-09 | Disposition: A | Discharge status: Home / Self Care | Payer: Medicaid Other | Source: Ambulatory Visit | Attending: Internal Medicine | Admitting: Internal Medicine

## 2016-07-09 DIAGNOSIS — Z1231 Encounter for screening mammogram for malignant neoplasm of breast: Secondary | ICD-10-CM

## 2016-11-30 DIAGNOSIS — E669 Obesity, unspecified: Secondary | ICD-10-CM | POA: Insufficient documentation

## 2017-05-24 ENCOUNTER — Emergency Department (HOSPITAL_BASED_OUTPATIENT_CLINIC_OR_DEPARTMENT_OTHER)
Admission: EM | Admit: 2017-05-24 | Discharge: 2017-05-24 | Disposition: A | Discharge status: Home / Self Care | Payer: Medicaid Other | Attending: Emergency Medicine | Admitting: Emergency Medicine

## 2017-05-24 ENCOUNTER — Encounter (HOSPITAL_BASED_OUTPATIENT_CLINIC_OR_DEPARTMENT_OTHER): Payer: Self-pay | Admitting: *Deleted

## 2017-05-24 DIAGNOSIS — W57XXXA Bitten or stung by nonvenomous insect and other nonvenomous arthropods, initial encounter: Secondary | ICD-10-CM | POA: Insufficient documentation

## 2017-05-24 DIAGNOSIS — F17210 Nicotine dependence, cigarettes, uncomplicated: Secondary | ICD-10-CM | POA: Diagnosis not present

## 2017-05-24 DIAGNOSIS — I10 Essential (primary) hypertension: Secondary | ICD-10-CM | POA: Insufficient documentation

## 2017-05-24 DIAGNOSIS — Z79899 Other long term (current) drug therapy: Secondary | ICD-10-CM | POA: Insufficient documentation

## 2017-05-24 DIAGNOSIS — L03213 Periorbital cellulitis: Secondary | ICD-10-CM | POA: Diagnosis not present

## 2017-05-24 DIAGNOSIS — H05222 Edema of left orbit: Secondary | ICD-10-CM | POA: Diagnosis present

## 2017-05-24 MED ORDER — TETRACAINE HCL 0.5 % OP SOLN
1.0000 [drp] | Freq: Once | OPHTHALMIC | Status: AC
  Administered 2017-05-24: 1 [drp] via OPHTHALMIC
  Filled 2017-05-24: qty 4

## 2017-05-24 MED ORDER — CLINDAMYCIN HCL 300 MG PO CAPS: 300.0000 mg | ORAL_CAPSULE | Freq: Three times a day (TID) | ORAL | 0 refills | Status: AC

## 2017-05-24 MED ORDER — FLUORESCEIN SODIUM 0.6 MG OP STRP
1.0000 | ORAL_STRIP | Freq: Once | OPHTHALMIC | Status: AC
  Administered 2017-05-24: 1 via OPHTHALMIC
  Filled 2017-05-24: qty 1

## 2017-05-24 NOTE — ED Provider Notes (Signed)
[facility] Provider [facility]   CSN: [id number] Arrival date & time: 05/24/17  1048   History   Chief Complaint Chief Complaint  Patient presents with  . Insect Bite    HPI Tanya Fry is a 46 y.o. female who presents with 2 days of redness/swelling to the left lower periorbital region. Patient reports that an insect bit her 2 days ago she's been expressing symptoms ever since. Patient reports a small blister that developed that burst yesterday. Patient has been taking ibuprofen with minimal improvement in symptoms. She has not tried any other therapies. Patient denies any known allergies. Patient notes that she has baseline blurry vision denies any changes since incident.  Patient denies any eye trauma. She denies any eye pain/redness, photophobia, vision changes, fevers, difficulty breathing, lip or throat swelling.  The history is provided by the patient.    Past Medical History:  Diagnosis Date  . Anemia   . Hypertension    CHTN, no current meds  . Postpartum cardiomyopathy     Patient Active Problem List   Diagnosis Date Noted  . Hypokalemia 10/15/2013  . Acute exacerbation of CHF (congestive heart failure) (HCC) 10/14/2013  . 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 SVD (spontaneous vaginal delivery) 10/03/2013  . Sterilization 10/03/2013  . Pregnancy 10/03/2013  . Abnormal fetal ultrasound 10/02/2013  . 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 History of incompetent cervix, currently pregnant 08/02/2013  . HTN (hypertension), malignant 08/02/2013  . Gonorrhea complicating pregnancy in third trimester 08/02/2013  . Elderly multigravida with antepartum condition or complication 08/02/2013  . High risk pregnancy due to history of preterm labor in third trimester 08/02/2013    Past Surgical History:  Procedure Laterality Date  . CERVICAL  CERCLAGE    . GASTRECTOMY    . NO PAST SURGERIES    . TUBAL LIGATION Bilateral 10/03/2013   Procedure: POST PARTUM TUBAL LIGATION;  Surgeon: Tereso Newcomer, MD;  Location: [facility];  Service: Gynecology;  Laterality: Bilateral;    OB History    Gravida Para Term Preterm AB Living   8 4 3 1 4 4    SAB TAB Ectopic Multiple Live Births   4       4       Home Medications    Prior to Admission medications   Medication Sig Start Date End Date Taking? Authorizing Provider  acetaminophen (TYLENOL) 500 MG tablet Take 1,000 mg by mouth every 6 (six) hours as needed for mild pain.     [provider]  Cholecalciferol (VITAMIN D3) 2000 units capsule Take 1 capsule by mouth daily.    [provider]  clindamycin (CLEOCIN) 300 MG capsule Take 1 capsule (300 mg total) by mouth 3 (three) times daily. 05/24/17 05/31/17  Maxwell Caul, PA-C  hydrochlorothiazide (HYDRODIURIL) 50 MG tablet Take 1 tablet (50 mg total) by mouth daily. 10/17/13   Penny Pia, MD  labetalol (NORMODYNE) 300 MG tablet Take 1 tablet (300 mg total) by mouth 2 (two) times daily. Patient not taking: Reported on 11/13/2015 10/17/13   Penny Pia, MD  naproxen sodium (ANAPROX) 220 MG tablet Take 440 mg by mouth 2 (two) times daily as needed (pain).    [provider]  potassium chloride SA (K-DUR,KLOR-CON) 20 MEQ tablet Take 1 tablet (20 mEq total) by mouth daily. Patient taking differently: Take 20 mEq by mouth 3 (three) times a week.  10/17/13   Penny Pia, MD    Family History Family History  Problem Relation Age of Onset  . Cancer Maternal Grandmother        breast cancer  . Cancer Paternal Grandfather        prostate  . Heart failure Father 46    Social History Social History  Substance Use Topics  . Smoking status: Current Every Day Smoker    Packs/day: 0.50    Types: Cigarettes    Last attempt to quit: 12/28/2012  . Smokeless tobacco: Never Used  . Alcohol use Yes     Allergies     Shellfish allergy   Review of Systems Review of Systems  Constitutional: Negative for fever.  HENT: Negative for trouble swallowing.   Eyes: Negative for photophobia and visual disturbance.       Periorbital swelling and redness  Respiratory: Negative for shortness of breath.      Physical Exam Updated Vital Signs BP (!) 142/70   Pulse 81   Temp 98.4 F (36.9 C) (Oral)   Resp 20   Ht 5' 6.5" (1.689 m)   Wt 86.2 kg (190 lb)   LMP 05/10/2017   SpO2 100%   BMI 30.21 kg/m   Physical Exam  Constitutional: She appears well-developed and well-nourished.  Sitting comfortably on examination table  HENT:  Head: Normocephalic and atraumatic.  Mouth/Throat: Oropharynx is clear and moist and mucous membranes are normal.  No oral angioedema. Mild tenderness palpation to the lower left periorbital region with some mild surrounding erythema. There is a small 0.5 cm abrasion noted. No active discharge.  Eyes: Pupils are equal, round, and reactive to light. Conjunctivae, EOM and lids are normal. Right eye exhibits no discharge. Left eye exhibits no discharge. Right conjunctiva is not injected. Right conjunctiva has no hemorrhage. Left conjunctiva is not injected. Left conjunctiva has no hemorrhage. No scleral icterus.  EOMs intact without difficult. No conjunctival injection bilaterally. No discharge noted.  Pulmonary/Chest: Effort normal.  No evidence of respiratory distress. Able to speak in full sentences without difficulty.  Neurological: She is alert.  Skin: Skin is warm and dry.  Psychiatric: She has a normal mood and affect. Her speech is normal and behavior is normal.  Nursing note and vitals reviewed.    ED Treatments / Results  Labs (all labs ordered are listed, but only abnormal results are displayed) Labs Reviewed - No data to display  EKG  EKG Interpretation None       Radiology No results found.  Procedures Procedures (including critical care  time)  Medications Ordered in ED Medications  tetracaine (PONTOCAINE) 0.5 % ophthalmic solution 1 drop (1 drop Right Eye Given 05/24/17 1138)  fluorescein ophthalmic strip 1 strip (1 strip Right Eye Given 05/24/17 1138)     Initial Impression / Assessment and Plan / ED Course  I have reviewed the triage vital signs and the nursing notes.  Pertinent labs & imaging results that were available during my care of the patient were reviewed by me and considered in my medical decision making (see chart for details).     46 year old female who presents with swelling and redness to the left periorbital region 2 days. Patient reports that symptoms began after an insect bite. Patient does note that she had a small blister to the area which is now opened up. Patient denies any fevers, vision changes, eye pain/redness. Patient is afebrile, non-toxic appearing, sitting comfortably on examination table. Vital signs reviewed and stable.  Eyes are PERRL. No pain with EOMs. No conjunctival injection. No photophobia. History/physical examination concerning for orbital cellulitis. Low suspicion for corneal abrasion but we'll evaluate with Woods lamp. Concern for preseptal cellulitis versus localized reaction from insect bite. Visual acuity as admitted above. Patient has baseline blurry vision and denies any change from baseline.   Woods lamp evaluation shows no corneal abrasion or fluorescein uptake. Symptoms likely consistent with early preseptal cellulitis likely caused by insect bite. Will plan to consult ophthalmology for outpatient follow-up.     Visual Acuity  Right Eye Distance: 20/40 Left Eye Distance: 20/70 Bilateral Distance: 20/40  Discussed patient with Dr. Genia DelMincey (Optho). Will plan to see patient in outpatient follow-up with the next 2 days. Updated patient on plan. Conservative therapies discussed. Strict return precautions discussed. Patient expresses understanding and agreement to  plan.   Final Clinical Impressions(s) / ED Diagnoses   Final diagnoses:  Insect bite, initial encounter  Preseptal cellulitis    New Prescriptions New Prescriptions   CLINDAMYCIN (CLEOCIN) 300 MG CAPSULE    Take 1 capsule (300 mg total) by mouth 3 (three) times daily.     Maxwell CaulLayden, Pershing Skidmore A, PA-C 05/24/17 1305    Arby BarrettePfeiffer, Marcy, MD 06/03/17 [phone]

## 2017-05-24 NOTE — Discharge Instructions (Signed)
Take antibiotics as directed. Please take all of your antibiotics until finished.  Apply cold compresses to the effected area.   You can take Tylenol or Ibuprofen as directed for pain.  Follow-up with the referred eye doctor. Call and arrange and appointment in 2 days.   Return to the Emergency Department for any worsening pain, vision changes, fevers, difficulty breathing, eye pain, or redness.

## 2017-05-24 NOTE — ED Triage Notes (Signed)
She woke 2 days ago with a small blister under her left eye that she felt was a spider bite. The blister opened while taking a shower. Swelling and redness under her eye.

## 2017-12-24 ENCOUNTER — Emergency Department (HOSPITAL_COMMUNITY)
Admission: EM | Admit: 2017-12-24 | Discharge: 2017-12-24 | Disposition: A | Discharge status: Home / Self Care | Payer: Medicaid Other | Attending: Emergency Medicine | Admitting: Emergency Medicine

## 2017-12-24 ENCOUNTER — Encounter (HOSPITAL_COMMUNITY): Payer: Self-pay | Admitting: Emergency Medicine

## 2017-12-24 ENCOUNTER — Other Ambulatory Visit: Payer: Self-pay

## 2017-12-24 DIAGNOSIS — I10 Essential (primary) hypertension: Secondary | ICD-10-CM | POA: Insufficient documentation

## 2017-12-24 DIAGNOSIS — Z79899 Other long term (current) drug therapy: Secondary | ICD-10-CM | POA: Diagnosis not present

## 2017-12-24 DIAGNOSIS — I509 Heart failure, unspecified: Secondary | ICD-10-CM | POA: Diagnosis not present

## 2017-12-24 DIAGNOSIS — R1084 Generalized abdominal pain: Secondary | ICD-10-CM | POA: Insufficient documentation

## 2017-12-24 DIAGNOSIS — R197 Diarrhea, unspecified: Secondary | ICD-10-CM | POA: Diagnosis not present

## 2017-12-24 DIAGNOSIS — R109 Unspecified abdominal pain: Secondary | ICD-10-CM

## 2017-12-24 DIAGNOSIS — R112 Nausea with vomiting, unspecified: Secondary | ICD-10-CM | POA: Diagnosis present

## 2017-12-24 DIAGNOSIS — F17210 Nicotine dependence, cigarettes, uncomplicated: Secondary | ICD-10-CM | POA: Insufficient documentation

## 2017-12-24 LAB — COMPREHENSIVE METABOLIC PANEL
ALT: 21 U/L (ref 14–54)
AST: 24 U/L (ref 15–41)
Albumin: 4.3 g/dL (ref 3.5–5.0)
Alkaline Phosphatase: 57 U/L (ref 38–126)
Anion gap: 10 (ref 5–15)
BUN: 11 mg/dL (ref 6–20)
CO2: 24 mmol/L (ref 22–32)
Calcium: 9 mg/dL (ref 8.9–10.3)
Chloride: 106 mmol/L (ref 101–111)
Creatinine, Ser: 0.74 mg/dL (ref 0.44–1.00)
GFR calc Af Amer: >60 mL/min (ref >60)
GFR calc non Af Amer: >60 mL/min (ref >60)
Glucose, Bld: 106 mg/dL — ABNORMAL HIGH (ref 65–99)
Potassium: 3.1 mmol/L — ABNORMAL LOW (ref 3.5–5.1)
Sodium: 140 mmol/L (ref 135–145)
Total Bilirubin: 0.5 mg/dL (ref 0.3–1.2)
Total Protein: 7.5 g/dL (ref 6.5–8.1)

## 2017-12-24 LAB — URINALYSIS, ROUTINE W REFLEX MICROSCOPIC
Bacteria, UA: NONE SEEN
Bilirubin Urine: NEGATIVE
Glucose, UA: NEGATIVE mg/dL
Hgb urine dipstick: NEGATIVE
Ketones, ur: 5 mg/dL — AB
Leukocytes, UA: NEGATIVE
Nitrite: NEGATIVE
Protein, ur: 30 mg/dL — AB
RBC / HPF: NONE SEEN RBC/hpf (ref 0–5)
Specific Gravity, Urine: 1.038 — ABNORMAL HIGH (ref 1.005–1.030)
pH: 5 (ref 5.0–8.0)

## 2017-12-24 LAB — CBC
HCT: 42.2 % (ref 36.0–46.0)
Hemoglobin: 14.5 g/dL (ref 12.0–15.0)
MCH: 32.4 pg (ref 26.0–34.0)
MCHC: 34.4 g/dL (ref 30.0–36.0)
MCV: 94.4 fL (ref 78.0–100.0)
Platelets: 298 10*3/uL (ref 150–400)
RBC: 4.47 MIL/uL (ref 3.87–5.11)
RDW: 13.2 % (ref 11.5–15.5)
WBC: 11.7 10*3/uL — ABNORMAL HIGH (ref 4.0–10.5)

## 2017-12-24 LAB — I-STAT BETA HCG BLOOD, ED (MC, WL, AP ONLY): I-stat hCG, quantitative: <5 m[IU]/mL (ref <5)

## 2017-12-24 LAB — LIPASE, BLOOD: Lipase: 26 U/L (ref 11–51)

## 2017-12-24 MED ORDER — LOPERAMIDE HCL 2 MG PO CAPS
4.0000 mg | ORAL_CAPSULE | Freq: Once | ORAL | Status: AC
  Administered 2017-12-24: 4 mg via ORAL
  Filled 2017-12-24: qty 2

## 2017-12-24 MED ORDER — HYDROMORPHONE HCL 1 MG/ML IJ SOLN
0.5000 mg | Freq: Once | INTRAMUSCULAR | Status: AC
  Administered 2017-12-24: 0.5 mg via INTRAVENOUS
  Filled 2017-12-24: qty 1

## 2017-12-24 MED ORDER — SODIUM CHLORIDE 0.9 % IV BOLUS (SEPSIS)
1000.0000 mL | Freq: Once | INTRAVENOUS | Status: AC
  Administered 2017-12-24: 1000 mL via INTRAVENOUS

## 2017-12-24 MED ORDER — POTASSIUM CHLORIDE CRYS ER 20 MEQ PO TBCR
40.0000 meq | EXTENDED_RELEASE_TABLET | Freq: Once | ORAL | Status: AC
  Administered 2017-12-24: 40 meq via ORAL
  Filled 2017-12-24: qty 2

## 2017-12-24 MED ORDER — PROMETHAZINE HCL 25 MG/ML IJ SOLN
12.5000 mg | Freq: Once | INTRAMUSCULAR | Status: AC
  Administered 2017-12-24: 12.5 mg via INTRAVENOUS
  Filled 2017-12-24: qty 1

## 2017-12-24 MED ORDER — ONDANSETRON HCL 4 MG PO TABS: 4.0000 mg | ORAL_TABLET | Freq: Four times a day (QID) | ORAL | 0 refills | Status: DC | PRN

## 2017-12-24 NOTE — ED Provider Notes (Signed)
[facility]-EMERGENCY DEPT Provider Note   CSN: [id number] Arrival date & time: 12/24/17  0524     History   Chief Complaint Chief Complaint  Patient presents with  . Abdominal Pain  . Emesis  . Diarrhea    HPI Tanya Fry is a 47 y.o. female.  HPI   47 year old female with nausea, vomiting and diarrhea.  She began having intermittent crampy abdominal pain yesterday evening shortly before she went to bed.  She woke up around midnight feeling nauseated so she went to the bathroom and vomited.  She is actually had diarrhea and multiple more episodes of vomiting.  She is getting to have diffuse crampy abdominal pain, but worse in the upper abdomen.  85-year-old daughter recently with diarrhea but no vomiting.  She denies any fevers.  No blood in her stool or emesis.  Past Medical History:  Diagnosis Date  . Anemia   . Hypertension    CHTN, no current meds  . Postpartum cardiomyopathy     Patient Active Problem List   Diagnosis Date Noted  . Hypokalemia 10/15/2013  . Acute exacerbation of CHF (congestive heart failure) (HCC) 10/14/2013  . 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 SVD (spontaneous vaginal delivery) 10/03/2013  . Sterilization 10/03/2013  . Pregnancy 10/03/2013  . Abnormal fetal ultrasound 10/02/2013  . 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 History of incompetent cervix, currently pregnant 08/02/2013  . HTN (hypertension), malignant 08/02/2013  . Gonorrhea complicating pregnancy in third trimester 08/02/2013  . Elderly multigravida with antepartum condition or complication 08/02/2013  . High risk pregnancy due to history of preterm labor in third trimester 08/02/2013    Past Surgical History:  Procedure Laterality Date  . CERVICAL CERCLAGE    . GASTRECTOMY    . NO PAST SURGERIES    . TUBAL LIGATION  Bilateral 10/03/2013   Procedure: POST PARTUM TUBAL LIGATION;  Surgeon: Tereso Newcomer, MD;  Location: [facility];  Service: Gynecology;  Laterality: Bilateral;    OB History    Gravida Para Term Preterm AB Living   8 4 3 1 4 4    SAB TAB Ectopic Multiple Live Births   4       4       Home Medications    Prior to Admission medications   Medication Sig Start Date End Date Taking? Authorizing Provider  acetaminophen (TYLENOL) 500 MG tablet Take 1,000 mg by mouth every 6 (six) hours as needed for mild pain.     [provider]  Cholecalciferol (VITAMIN D3) 2000 units capsule Take 1 capsule by mouth daily.    [provider]  hydrochlorothiazide (HYDRODIURIL) 50 MG tablet Take 1 tablet (50 mg total) by mouth daily. 10/17/13   Penny Pia, MD  labetalol (NORMODYNE) 300 MG tablet Take 1 tablet (300 mg total) by mouth 2 (two) times daily. Patient not taking: Reported on 11/13/2015 10/17/13   Penny Pia, MD  naproxen sodium (ANAPROX) 220 MG tablet Take 440 mg by mouth 2 (two) times daily as needed (pain).    [provider]  potassium chloride SA (K-DUR,KLOR-CON) 20 MEQ tablet Take 1 tablet (20 mEq total) by mouth daily. Patient taking differently: Take 20 mEq by mouth 3 (three) times a week.  10/17/13   Penny Pia, MD    Family History Family History  Problem Relation Age of Onset  . Cancer Maternal Grandmother  breast cancer  . Cancer Paternal Grandfather        prostate  . Heart failure Father [id number]    Social History Social History   Tobacco Use  . Smoking status: Current Every Day Smoker    Packs/day: 0.50    Types: Cigarettes    Last attempt to quit: 12/28/2012    Years since quitting: 4.9  . Smokeless tobacco: Never Used  Substance Use Topics  . Alcohol use: No    Frequency: Never  . Drug use: No     Allergies   Shellfish allergy   Review of Systems Review of Systems  All systems reviewed and negative, other than as noted in  HPI.  Physical Exam Updated Vital Signs BP 130/76 (BP Location: Left Arm)   Pulse 91   Temp 98.3 F (36.8 C) (Oral)   Resp 19   SpO2 98%   Physical Exam  Constitutional: She appears well-developed and well-nourished. No distress.  HENT:  Head: Normocephalic and atraumatic.  Eyes: Conjunctivae are normal. Right eye exhibits no discharge. Left eye exhibits no discharge.  Neck: Neck supple.  Cardiovascular: Normal rate, regular rhythm and normal heart sounds. Exam reveals no gallop and no friction rub.  No murmur heard. Pulmonary/Chest: Effort normal and breath sounds normal. No respiratory distress.  Abdominal: Soft. She exhibits no distension. There is no tenderness.  Musculoskeletal: She exhibits no edema or tenderness.  Neurological: She is alert.  Skin: Skin is warm and dry.  Psychiatric: She has a normal mood and affect. Her behavior is normal. Thought content normal.  Nursing note and vitals reviewed.    ED Treatments / Results  Labs (all labs ordered are listed, but only abnormal results are displayed) Labs Reviewed  COMPREHENSIVE METABOLIC PANEL - Abnormal; Notable for the following components:      Result Value   Potassium 3.1 (*)    Glucose, Bld 106 (*)    All other components within normal limits  CBC - Abnormal; Notable for the following components:   WBC 11.7 (*)    All other components within normal limits  LIPASE, BLOOD  URINALYSIS, ROUTINE W REFLEX MICROSCOPIC  I-STAT BETA HCG BLOOD, ED ([facility], [facility], [facility] ONLY)    EKG  EKG Interpretation None       Radiology No results found.  Procedures Procedures (including critical care time)  Medications Ordered in ED Medications  sodium chloride 0.9 % bolus 1,000 mL (not administered)  promethazine (PHENERGAN) injection 12.5 mg (not administered)  loperamide (IMODIUM) capsule 4 mg (not administered)     Initial Impression / Assessment and Plan / ED Course  I have reviewed the triage vital signs and the  nursing notes.  Pertinent labs & imaging results that were available during my care of the patient were reviewed by me and considered in my medical decision making (see chart for details).     47 year old female with nausea, vomiting and diarrhea.  Suspect viral GI illness.  Only mild tenderness on exam.  Labs are reassuring.  She is treated symptomatically with much improvement in the emergency room.  She will be discharged with a prescription for Zofran as needed.  Return precautions were discussed.  Outpatient follow-up otherwise.  Final Clinical Impressions(s) / ED Diagnoses   Final diagnoses:  Nausea vomiting and diarrhea  Abdominal pain, unspecified abdominal location    ED Discharge Orders    None       Raeford RazorKohut, Miriana Gaertner, MD 12/25/17 [phone]

## 2017-12-24 NOTE — ED Triage Notes (Signed)
Pt is c/o abd pain with nausea, vomiting, and diarrhea that started about midnight  Pt states the pain is mostly in her upper abdomen and comes and goes

## 2018-11-17 ENCOUNTER — Encounter (HOSPITAL_COMMUNITY): Payer: Self-pay

## 2018-11-17 ENCOUNTER — Emergency Department (HOSPITAL_COMMUNITY)
Admission: EM | Admit: 2018-11-17 | Discharge: 2018-11-17 | Disposition: A | Discharge status: Home / Self Care | Payer: Medicaid Other | Attending: Emergency Medicine | Admitting: Emergency Medicine

## 2018-11-17 ENCOUNTER — Other Ambulatory Visit: Payer: Self-pay

## 2018-11-17 DIAGNOSIS — R1010 Upper abdominal pain, unspecified: Secondary | ICD-10-CM | POA: Diagnosis present

## 2018-11-17 DIAGNOSIS — R112 Nausea with vomiting, unspecified: Secondary | ICD-10-CM | POA: Insufficient documentation

## 2018-11-17 DIAGNOSIS — R197 Diarrhea, unspecified: Secondary | ICD-10-CM | POA: Diagnosis not present

## 2018-11-17 DIAGNOSIS — Z79899 Other long term (current) drug therapy: Secondary | ICD-10-CM | POA: Diagnosis not present

## 2018-11-17 DIAGNOSIS — F17210 Nicotine dependence, cigarettes, uncomplicated: Secondary | ICD-10-CM | POA: Diagnosis not present

## 2018-11-17 DIAGNOSIS — I10 Essential (primary) hypertension: Secondary | ICD-10-CM | POA: Diagnosis not present

## 2018-11-17 LAB — URINALYSIS, ROUTINE W REFLEX MICROSCOPIC
Bilirubin Urine: NEGATIVE
Glucose, UA: NEGATIVE mg/dL
Hgb urine dipstick: NEGATIVE
Ketones, ur: 5 mg/dL — AB
Nitrite: NEGATIVE
Protein, ur: 30 mg/dL — AB
Specific Gravity, Urine: 1.035 — ABNORMAL HIGH (ref 1.005–1.030)
pH: 5 (ref 5.0–8.0)

## 2018-11-17 LAB — COMPREHENSIVE METABOLIC PANEL
ALT: 24 U/L (ref 0–44)
AST: 22 U/L (ref 15–41)
Albumin: 4.9 g/dL (ref 3.5–5.0)
Alkaline Phosphatase: 63 U/L (ref 38–126)
Anion gap: 11 (ref 5–15)
BUN: 12 mg/dL (ref 6–20)
CO2: 24 mmol/L (ref 22–32)
Calcium: 9.5 mg/dL (ref 8.9–10.3)
Chloride: 106 mmol/L (ref 98–111)
Creatinine, Ser: 0.83 mg/dL (ref 0.44–1.00)
GFR calc Af Amer: >60 mL/min (ref >60)
GFR calc non Af Amer: >60 mL/min (ref >60)
Glucose, Bld: 97 mg/dL (ref 70–99)
Potassium: 3.7 mmol/L (ref 3.5–5.1)
Sodium: 141 mmol/L (ref 135–145)
Total Bilirubin: 0.6 mg/dL (ref 0.3–1.2)
Total Protein: 8.8 g/dL — ABNORMAL HIGH (ref 6.5–8.1)

## 2018-11-17 LAB — I-STAT BETA HCG BLOOD, ED (MC, WL, AP ONLY): I-stat hCG, quantitative: <5 m[IU]/mL (ref <5)

## 2018-11-17 LAB — CBC
HCT: 47.8 % — ABNORMAL HIGH (ref 36.0–46.0)
Hemoglobin: 15.5 g/dL — ABNORMAL HIGH (ref 12.0–15.0)
MCH: 31.6 pg (ref 26.0–34.0)
MCHC: 32.4 g/dL (ref 30.0–36.0)
MCV: 97.6 fL (ref 80.0–100.0)
Platelets: 250 10*3/uL (ref 150–400)
RBC: 4.9 MIL/uL (ref 3.87–5.11)
RDW: 12.4 % (ref 11.5–15.5)
WBC: 7.7 10*3/uL (ref 4.0–10.5)
nRBC: 0 % (ref 0.0–0.2)

## 2018-11-17 LAB — LIPASE, BLOOD: Lipase: 28 U/L (ref 11–51)

## 2018-11-17 MED ORDER — SODIUM CHLORIDE 0.9% FLUSH
3.0000 mL | Freq: Once | INTRAVENOUS | Status: AC
  Administered 2018-11-17: 3 mL via INTRAVENOUS

## 2018-11-17 MED ORDER — DICYCLOMINE HCL 10 MG PO CAPS
10.0000 mg | ORAL_CAPSULE | Freq: Once | ORAL | Status: AC
  Administered 2018-11-17: 10 mg via ORAL
  Filled 2018-11-17: qty 1

## 2018-11-17 MED ORDER — HYDROMORPHONE HCL 1 MG/ML IJ SOLN
0.5000 mg | Freq: Once | INTRAMUSCULAR | Status: AC
  Administered 2018-11-17: 0.5 mg via INTRAVENOUS
  Filled 2018-11-17: qty 1

## 2018-11-17 MED ORDER — LOPERAMIDE HCL 2 MG PO CAPS
4.0000 mg | ORAL_CAPSULE | Freq: Once | ORAL | Status: AC
  Administered 2018-11-17: 4 mg via ORAL
  Filled 2018-11-17: qty 2

## 2018-11-17 MED ORDER — LACTATED RINGERS IV BOLUS
1000.0000 mL | Freq: Once | INTRAVENOUS | Status: AC
  Administered 2018-11-17: 1000 mL via INTRAVENOUS

## 2018-11-17 MED ORDER — PROMETHAZINE HCL 25 MG/ML IJ SOLN
12.5000 mg | Freq: Once | INTRAMUSCULAR | Status: AC
  Administered 2018-11-17: 12.5 mg via INTRAVENOUS
  Filled 2018-11-17: qty 1

## 2018-11-17 MED ORDER — ONDANSETRON HCL 4 MG PO TABS: 4.0000 mg | ORAL_TABLET | Freq: Three times a day (TID) | ORAL | 0 refills | Status: DC | PRN

## 2018-11-17 NOTE — ED Triage Notes (Signed)
Pt states she is having N/V/D and abd pain since 0400. Pt describes pain as upper abd. Pt states emesis x 6, and "I don't know how many times I had diarrhea". Pt states family has had similar symptoms.

## 2018-11-24 NOTE — ED Provider Notes (Signed)
[facility]-EMERGENCY DEPT Provider Note   CSN: [id number] Arrival date & time: 11/17/18  [id number]     History   Chief Complaint Chief Complaint  Patient presents with  . Abdominal Pain    HPI Tanya Fry is a 48 y.o. female.  HPI   48 year old female with upper abdominal pain and nausea, vomiting and diarrhea.  Onset around 4 AM.  Multiple family members with vomiting diarrhea recently.  She vomited approximately 6 times and episodes of diarrhea.  Subjective fever.  No blood in stool or emesis.  Some crampy abdominal pain worse in the upper abdomen.  Comes and goes.  She has not tried take anything for symptoms.  Past Medical History:  Diagnosis Date  . Anemia   . Hypertension    CHTN, no current meds  . Postpartum cardiomyopathy     Patient Active Problem List   Diagnosis Date Noted  . Hypokalemia 10/15/2013  . Acute exacerbation of CHF (congestive heart failure) (HCC) 10/14/2013  . 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 SVD (spontaneous vaginal delivery) 10/03/2013  . Sterilization 10/03/2013  . Pregnancy 10/03/2013  . Abnormal fetal ultrasound 10/02/2013  . 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 History of incompetent cervix, currently pregnant 08/02/2013  . HTN (hypertension), malignant 08/02/2013  . Gonorrhea complicating pregnancy in third trimester 08/02/2013  . Elderly multigravida with antepartum condition or complication 08/02/2013  . High risk pregnancy due to history of preterm labor in third trimester 08/02/2013    Past Surgical History:  Procedure Laterality Date  . CERVICAL CERCLAGE    . GASTRECTOMY    . NO PAST SURGERIES    . TUBAL LIGATION Bilateral 10/03/2013   Procedure: POST PARTUM TUBAL LIGATION;  Surgeon: Tereso NewcomerUgonna A Anyanwu, MD;  Location: [facility];  Service: Gynecology;  Laterality: Bilateral;      OB History    Gravida  8   Para  4   Term  3   Preterm  1   AB  4   Living  4     SAB  4   TAB      Ectopic      Multiple      Live Births  4            Home Medications    Prior to Admission medications   Medication Sig Start Date End Date Taking? Authorizing Provider  acetaminophen (TYLENOL) 500 MG tablet Take 1,000 mg by mouth every 6 (six) hours as needed for mild pain.    Yes [provider]  Cholecalciferol (VITAMIN D3) 2000 units capsule Take 1 capsule by mouth daily.   Yes [provider]  diphenhydrAMINE (BENADRYL) 25 MG tablet Take 25 mg by mouth every 6 (six) hours as needed for allergies.   Yes [provider]  Multiple Vitamin (MULTIVITAMIN WITH MINERALS) TABS tablet Take 1 tablet by mouth daily.   Yes [provider]  hydrochlorothiazide (HYDRODIURIL) 50 MG tablet Take 1 tablet (50 mg total) by mouth daily. Patient not taking: Reported on 12/24/2017 10/17/13   [name], [place], MD  labetalol (NORMODYNE) 300 MG tablet Take 1 tablet (300 mg total) by mouth 2 (two) times daily. Patient not taking: Reported on 12/24/2017 10/17/13   [name], [place], MD  ondansetron (ZOFRAN) 4 MG tablet Take 1 tablet (4 mg total) by mouth every 8 (eight) hours as needed for nausea or vomiting. 11/17/18  Raeford Razor, MD  potassium chloride SA (K-DUR,KLOR-CON) 20 MEQ tablet Take 1 tablet (20 mEq total) by mouth daily. Patient not taking: Reported on 12/24/2017 10/17/13   Penny Pia, MD    Family History Family History  Problem Relation Age of Onset  . Cancer Maternal Grandmother        breast cancer  . Cancer Paternal Grandfather        prostate  . Heart failure Father 59    Social History Social History   Tobacco Use  . Smoking status: Current Every Day Smoker    Packs/day: 0.50    Types: Cigarettes  . Smokeless tobacco: Never Used  Substance Use Topics  . Alcohol use: No    Frequency: Never  . Drug use: No     Allergies    Shellfish allergy   Review of Systems Review of Systems  All systems reviewed and negative, other than as noted in HPI.  Physical Exam Updated Vital Signs BP (!) 158/83   Pulse 67   Temp 100.2 F (37.9 C) (Oral)   Resp 16   Ht 5\' 6"  (1.676 m)   Wt 72.6 kg   SpO2 99%   BMI 25.82 kg/m   Physical Exam Vitals signs and nursing note reviewed.  Constitutional:      General: She is not in acute distress.    Appearance: She is well-developed.  HENT:     Head: Normocephalic and atraumatic.  Eyes:     General:        Right eye: No discharge.        Left eye: No discharge.     Conjunctiva/sclera: Conjunctivae normal.  Neck:     Musculoskeletal: Neck supple.  Cardiovascular:     Rate and Rhythm: Regular rhythm. Tachycardia present.     Heart sounds: Normal heart sounds. No murmur. No friction rub. No gallop.   Pulmonary:     Effort: Pulmonary effort is normal. No respiratory distress.     Breath sounds: Normal breath sounds.  Abdominal:     General: There is no distension.     Palpations: Abdomen is soft.     Tenderness: There is no abdominal tenderness.  Musculoskeletal:        General: No tenderness.  Skin:    General: Skin is warm and dry.  Neurological:     Mental Status: She is alert.  Psychiatric:        Behavior: Behavior normal.        Thought Content: Thought content normal.      ED Treatments / Results  Labs (all labs ordered are listed, but only abnormal results are displayed) Labs Reviewed  COMPREHENSIVE METABOLIC PANEL - Abnormal; Notable for the following components:      Result Value   Total Protein 8.8 (*)    All other components within normal limits  CBC - Abnormal; Notable for the following components:   Hemoglobin 15.5 (*)    HCT 47.8 (*)    All other components within normal limits  URINALYSIS, ROUTINE W REFLEX MICROSCOPIC - Abnormal; Notable for the following components:   APPearance HAZY (*)    Specific Gravity, Urine 1.035 (*)     Ketones, ur 5 (*)    Protein, ur 30 (*)    Leukocytes, UA MODERATE (*)    Bacteria, UA RARE (*)    All other components within normal limits  LIPASE, BLOOD  I-STAT BETA HCG BLOOD, ED ([facility], [facility], [facility] ONLY)  EKG None  Radiology No results found.   Procedures Procedures (including critical care time)  Medications Ordered in ED Medications  sodium chloride flush (NS) 0.9 % injection 3 mL (3 mLs Intravenous Given 11/17/18 1211)  lactated ringers bolus 1,000 mL (0 mLs Intravenous Stopped 11/17/18 1346)  loperamide (IMODIUM) capsule 4 mg (4 mg Oral Given 11/17/18 1311)  HYDROmorphone (DILAUDID) injection 0.5 mg (0.5 mg Intravenous Given 11/17/18 1311)  promethazine (PHENERGAN) injection 12.5 mg (12.5 mg Intravenous Given 11/17/18 1312)  dicyclomine (BENTYL) capsule 10 mg (10 mg Oral Given 11/17/18 1311)     Initial Impression / Assessment and Plan / ED Course  I have reviewed the triage vital signs and the nursing notes.  Pertinent labs & imaging results that were available during my care of the patient were reviewed by me and considered in my medical decision making (see chart for details).    I have reviewed the triage vital signs and the nursing notes. Prior records were reviewed for additional information.    Pertinent labs & imaging results that were available during my care of the patient were reviewed by me and considered in my medical decision making (see chart for details).  48 year old female with nausea/vomiting/diarrhea.  Suspect viral GI illness.  Some crampy abdominal pain, but abdominal exam is reassuring.  Tachycardic on presentation but this resolved with fluids and other treatment.  Multiple family members with similar symptoms.  Plan continue symptomatic treatment.    It has been determined that no acute conditions requiring further emergency intervention are present at this time. The patient has been advised of the diagnosis and plan. I reviewed any labs and imaging including  any potential incidental findings. I have reviewed nursing notes and appropriate previous records. We have discussed signs and symptoms that warrant return to the ED and they are listed in the discharge instructions.       Final Clinical Impressions(s) / ED Diagnoses   Final diagnoses:  Nausea vomiting and diarrhea    ED Discharge Orders         Ordered    ondansetron (ZOFRAN) 4 MG tablet  Every 8 hours PRN     11/17/18 1402           Raeford Razor, MD 11/24/18 [phone]

## 2018-12-02 ENCOUNTER — Other Ambulatory Visit: Payer: Self-pay | Admitting: Surgery

## 2018-12-02 DIAGNOSIS — Z903 Acquired absence of stomach [part of]: Secondary | ICD-10-CM

## 2018-12-02 DIAGNOSIS — K912 Postsurgical malabsorption, not elsewhere classified: Secondary | ICD-10-CM

## 2020-10-14 ENCOUNTER — Other Ambulatory Visit: Payer: Self-pay

## 2020-10-14 ENCOUNTER — Emergency Department (HOSPITAL_BASED_OUTPATIENT_CLINIC_OR_DEPARTMENT_OTHER): Payer: Medicaid Other

## 2020-10-14 DIAGNOSIS — F17210 Nicotine dependence, cigarettes, uncomplicated: Secondary | ICD-10-CM | POA: Insufficient documentation

## 2020-10-14 DIAGNOSIS — I509 Heart failure, unspecified: Secondary | ICD-10-CM | POA: Diagnosis not present

## 2020-10-14 DIAGNOSIS — R0602 Shortness of breath: Secondary | ICD-10-CM | POA: Diagnosis present

## 2020-10-14 DIAGNOSIS — I110 Hypertensive heart disease with heart failure: Secondary | ICD-10-CM | POA: Diagnosis not present

## 2020-10-14 DIAGNOSIS — Z79899 Other long term (current) drug therapy: Secondary | ICD-10-CM | POA: Diagnosis not present

## 2020-10-14 DIAGNOSIS — U071 COVID-19: Secondary | ICD-10-CM | POA: Insufficient documentation

## 2020-10-14 NOTE — ED Triage Notes (Signed)
Pt reports her daughter has COVID. Has some SOB and body aches

## 2020-10-15 ENCOUNTER — Emergency Department (HOSPITAL_BASED_OUTPATIENT_CLINIC_OR_DEPARTMENT_OTHER)
Admission: EM | Admit: 2020-10-15 | Discharge: 2020-10-15 | Disposition: A | Discharge status: Home / Self Care | Payer: Medicaid Other | Attending: Emergency Medicine | Admitting: Emergency Medicine

## 2020-10-15 DIAGNOSIS — I509 Heart failure, unspecified: Secondary | ICD-10-CM

## 2020-10-15 DIAGNOSIS — U071 COVID-19: Secondary | ICD-10-CM

## 2020-10-15 DIAGNOSIS — R0602 Shortness of breath: Secondary | ICD-10-CM

## 2020-10-15 LAB — BASIC METABOLIC PANEL
Anion gap: 10 (ref 5–15)
BUN: 15 mg/dL (ref 6–20)
CO2: 26 mmol/L (ref 22–32)
Calcium: 8.8 mg/dL — ABNORMAL LOW (ref 8.9–10.3)
Chloride: 103 mmol/L (ref 98–111)
Creatinine, Ser: 0.96 mg/dL (ref 0.44–1.00)
GFR, Estimated: >60 mL/min (ref >60)
Glucose, Bld: 90 mg/dL (ref 70–99)
Potassium: 3.4 mmol/L — ABNORMAL LOW (ref 3.5–5.1)
Sodium: 139 mmol/L (ref 135–145)

## 2020-10-15 LAB — RESP PANEL BY RT-PCR (FLU A&B, COVID) ARPGX2
Influenza A by PCR: NEGATIVE
Influenza B by PCR: NEGATIVE
SARS Coronavirus 2 by RT PCR: POSITIVE — AB

## 2020-10-15 LAB — CBC WITH DIFFERENTIAL/PLATELET
Abs Immature Granulocytes: 0.01 10*3/uL (ref 0.00–0.07)
Basophils Absolute: 0 10*3/uL (ref 0.0–0.1)
Basophils Relative: 0 %
Eosinophils Absolute: 0.1 10*3/uL (ref 0.0–0.5)
Eosinophils Relative: 1 %
HCT: 43.8 % (ref 36.0–46.0)
Hemoglobin: 14.8 g/dL (ref 12.0–15.0)
Immature Granulocytes: 0 %
Lymphocytes Relative: 16 %
Lymphs Abs: 1 10*3/uL (ref 0.7–4.0)
MCH: 31.9 pg (ref 26.0–34.0)
MCHC: 33.8 g/dL (ref 30.0–36.0)
MCV: 94.4 fL (ref 80.0–100.0)
Monocytes Absolute: 0.7 10*3/uL (ref 0.1–1.0)
Monocytes Relative: 11 %
Neutro Abs: 4.3 10*3/uL (ref 1.7–7.7)
Neutrophils Relative %: 72 %
Platelets: 219 10*3/uL (ref 150–400)
RBC: 4.64 MIL/uL (ref 3.87–5.11)
RDW: 13.2 % (ref 11.5–15.5)
WBC: 6 10*3/uL (ref 4.0–10.5)
nRBC: 0 % (ref 0.0–0.2)

## 2020-10-15 LAB — BRAIN NATRIURETIC PEPTIDE: B Natriuretic Peptide: 859.4 pg/mL — ABNORMAL HIGH (ref 0.0–100.0)

## 2020-10-15 LAB — TROPONIN I (HIGH SENSITIVITY): Troponin I (High Sensitivity): 25 ng/L — ABNORMAL HIGH (ref <18)

## 2020-10-15 MED ORDER — FUROSEMIDE 20 MG PO TABS
20.0000 mg | ORAL_TABLET | Freq: Once | ORAL | Status: AC
  Administered 2020-10-15: 20 mg via ORAL
  Filled 2020-10-15: qty 1

## 2020-10-15 MED ORDER — FUROSEMIDE 20 MG PO TABS: 20.0000 mg | ORAL_TABLET | Freq: Every day | ORAL | 0 refills | Status: DC

## 2020-10-15 NOTE — Discharge Instructions (Signed)
Begin taking Lasix as prescribed.  Follow-up with cardiology in the next week.  The contact information for the [facility] cardiology clinic has been provided in this discharge summary for you to call and make these arrangements.  Isolate at home for the next week.  Return to the emergency department if you develop chest pain, worsening breathing, or other new and concerning symptoms.      Person Under Monitoring Name: Tanya Fry  Location: [address] [place] [address]   Infection Prevention Recommendations for Individuals Confirmed to have, or Being Evaluated for, 2019 Novel Coronavirus (COVID-19) Infection Who Receive Care at Home  Individuals who are confirmed to have, or are being evaluated for, COVID-19 should follow the prevention steps below until a healthcare provider or local or state health department says they can return to normal activities.  Stay home except to get medical care You should restrict activities outside your home, except for getting medical care. Do not go to work, school, or public areas, and do not use public transportation or taxis.  Call ahead before visiting your doctor Before your medical appointment, call the healthcare provider and tell them that you have, or are being evaluated for, COVID-19 infection. This will help the healthcare providers office take steps to keep other people from getting infected. Ask your healthcare provider to call the local or state health department.  Monitor your symptoms Seek prompt medical attention if your illness is worsening (e.g., difficulty breathing). Before going to your medical appointment, call the healthcare provider and tell them that you have, or are being evaluated for, COVID-19 infection. Ask your healthcare provider to call the local or state health department.  Wear a facemask You should wear a facemask that covers your nose and mouth when you are in the same room  with other people and when you visit a healthcare provider. People who live with or visit you should also wear a facemask while they are in the same room with you.  Separate yourself from other people in your home As much as possible, you should stay in a different room from other people in your home. Also, you should use a separate bathroom, if available.  Avoid sharing household items You should not share dishes, drinking glasses, cups, eating utensils, towels, bedding, or other items with other people in your home. After using these items, you should wash them thoroughly with soap and water.  Cover your coughs and sneezes Cover your mouth and nose with a tissue when you cough or sneeze, or you can cough or sneeze into your sleeve. Throw used tissues in a lined trash can, and immediately wash your hands with soap and water for at least 20 seconds or use an alcohol-based hand rub.  Wash your [employer] your hands often and thoroughly with soap and water for at least 20 seconds. You can use an alcohol-based hand sanitizer if soap and water are not available and if your hands are not visibly dirty. Avoid touching your eyes, nose, and mouth with unwashed hands.   Prevention Steps for Caregivers and Household Members of Individuals Confirmed to have, or Being Evaluated for, COVID-19 Infection Being Cared for in the Home  If you live with, or provide care at home for, a person confirmed to have, or being evaluated for, COVID-19 infection please follow these guidelines to prevent infection:  Follow healthcare providers instructions Make sure that you understand and can help the patient follow any healthcare  provider instructions for all care.  Provide for the patients basic needs You should help the patient with basic needs in the home and provide support for getting groceries, prescriptions, and other personal needs.  Monitor the patients symptoms If they are getting sicker, call  his or her medical provider and tell them that the patient has, or is being evaluated for, COVID-19 infection. This will help the healthcare providers office take steps to keep other people from getting infected. Ask the healthcare provider to call the local or state health department.  Limit the number of people who have contact with the patient If possible, have only one caregiver for the patient. Other household members should stay in another home or place of residence. If this is not possible, they should stay in another room, or be separated from the patient as much as possible. Use a separate bathroom, if available. Restrict visitors who do not have an essential need to be in the home.  Keep older adults, very young children, and other sick people away from the patient Keep older adults, very young children, and those who have compromised immune systems or chronic health conditions away from the patient. This includes people with chronic heart, lung, or kidney conditions, diabetes, and cancer.  Ensure good ventilation Make sure that shared spaces in the home have good air flow, such as from an air conditioner or an opened window, weather permitting.  Wash your hands often Wash your hands often and thoroughly with soap and water for at least 20 seconds. You can use an alcohol based hand sanitizer if soap and water are not available and if your hands are not visibly dirty. Avoid touching your eyes, nose, and mouth with unwashed hands. Use disposable paper towels to dry your hands. If not available, use dedicated cloth towels and replace them when they become wet.  Wear a facemask and gloves Wear a disposable facemask at all times in the room and gloves when you touch or have contact with the patients blood, body fluids, and/or secretions or excretions, such as sweat, saliva, sputum, nasal mucus, vomit, urine, or feces.  Ensure the mask fits over your nose and mouth tightly, and do not  touch it during use. Throw out disposable facemasks and gloves after using them. Do not reuse. Wash your hands immediately after removing your facemask and gloves. If your personal clothing becomes contaminated, carefully remove clothing and launder. Wash your hands after handling contaminated clothing. Place all used disposable facemasks, gloves, and other waste in a lined container before disposing them with other household waste. Remove gloves and wash your hands immediately after handling these items.  Do not share dishes, glasses, or other household items with the patient Avoid sharing household items. You should not share dishes, drinking glasses, cups, eating utensils, towels, bedding, or other items with a patient who is confirmed to have, or being evaluated for, COVID-19 infection. After the person uses these items, you should wash them thoroughly with soap and water.  Wash laundry thoroughly Immediately remove and wash clothes or bedding that have blood, body fluids, and/or secretions or excretions, such as sweat, saliva, sputum, nasal mucus, vomit, urine, or feces, on them. Wear gloves when handling laundry from the patient. Read and follow directions on labels of laundry or clothing items and detergent. In general, wash and dry with the warmest temperatures recommended on the label.  Clean all areas the individual has used often Clean all touchable surfaces, such as counters, tabletops, doorknobs,  bathroom fixtures, toilets, phones, keyboards, tablets, and bedside tables, every day. Also, clean any surfaces that may have blood, body fluids, and/or secretions or excretions on them. Wear gloves when cleaning surfaces the patient has come in contact with. Use a diluted bleach solution (e.g., dilute bleach with 1 part bleach and 10 parts water) or a household disinfectant with a label that says EPA-registered for coronaviruses. To make a bleach solution at home, add 1 tablespoon of bleach  to 1 quart (4 cups) of water. For a larger supply, add  cup of bleach to 1 gallon (16 cups) of water. Read labels of cleaning products and follow recommendations provided on product labels. Labels contain instructions for safe and effective use of the cleaning product including precautions you should take when applying the product, such as wearing gloves or eye protection and making sure you have good ventilation during use of the product. Remove gloves and wash hands immediately after cleaning.  Monitor yourself for signs and symptoms of illness Caregivers and household members are considered close contacts, should monitor their health, and will be asked to limit movement outside of the home to the extent possible. Follow the monitoring steps for close contacts listed on the symptom monitoring form.   ? If you have additional questions, contact your local health department or call the epidemiologist on call at [phone] (available 24/7). ? This guidance is subject to change. For the most up-to-date guidance from [facility], please refer to their website: [email or web address]

## 2020-10-15 NOTE — ED Provider Notes (Signed)
[facility] EMERGENCY DEPARTMENT Provider Note   CSN: [id number] Arrival date & time: 10/14/20  1911     History Chief Complaint  Patient presents with  . Covid Exposure    Tanya Fry is a 50 y.o. female.  Patient is a 49 year old female with history of hypertension and postpartum cardiomyopathy.  She presents with a 5-day history of feeling short of breath and cough.  Patient states that her daughter with whom she lives was diagnosed with COVID-19.  Patient denies fevers or chills.  She denies swelling in her legs.  She tells me after her postpartum cardiomyopathy, she was seen by cardiologist on a few occasions, but has not needed to go back since.  The history is provided by the patient.       Past Medical History:  Diagnosis Date  . Anemia   . Hypertension    CHTN, no current meds  . Postpartum cardiomyopathy     Patient Active Problem List   Diagnosis Date Noted  . Hypokalemia 10/15/2013  . Acute exacerbation of CHF (congestive heart failure) ([place]) 10/14/2013  . 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 SVD (spontaneous vaginal delivery) 10/03/2013  . Sterilization 10/03/2013  . Pregnancy 10/03/2013  . Abnormal fetal ultrasound 10/02/2013  . 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 History of incompetent cervix, currently pregnant 08/02/2013  . HTN (hypertension), malignant 08/02/2013  . Gonorrhea complicating pregnancy in third trimester 08/02/2013  . Elderly multigravida with antepartum condition or complication 62/37/6283  . High risk pregnancy due to history of preterm labor in third trimester 08/02/2013    Past Surgical History:  Procedure Laterality Date  . CERVICAL CERCLAGE    . GASTRECTOMY    . NO PAST SURGERIES    . TUBAL LIGATION Bilateral 10/03/2013   Procedure: POST PARTUM TUBAL LIGATION;  Surgeon: Osborne Oman, MD;   Location: [facility];  Service: Gynecology;  Laterality: Bilateral;     OB History    Gravida  8   Para  4   Term  3   Preterm  1   AB  4   Living  4     SAB  4   IAB      Ectopic      Multiple      Live Births  4           Family History  Problem Relation Age of Onset  . Cancer Maternal Grandmother        breast cancer  . Cancer Paternal Grandfather        prostate  . Heart failure Father 66    Social History   Tobacco Use  . Smoking status: Current Every Day Smoker    Packs/day: 0.50    Types: Cigarettes  . Smokeless tobacco: Never Used  Substance Use Topics  . Alcohol use: No  . Drug use: No    Home Medications Prior to Admission medications   Medication Sig Start Date End Date Taking? Authorizing Provider  acetaminophen (TYLENOL) 500 MG tablet Take 1,000 mg by mouth every 6 (six) hours as needed for mild pain.     [provider]  Cholecalciferol (VITAMIN D3) 2000 units capsule Take 1 capsule by mouth daily.    [provider]  diphenhydrAMINE (BENADRYL) 25 MG tablet Take 25 mg by mouth every 6 (six) hours as needed for allergies.    [provider]  hydrochlorothiazide (HYDRODIURIL) 50 MG tablet Take 1 tablet (50 mg total) by mouth daily. Patient not taking: Reported on 12/24/2017 10/17/13   Penny Pia, MD  labetalol (NORMODYNE) 300 MG tablet Take 1 tablet (300 mg total) by mouth 2 (two) times daily. Patient not taking: Reported on 12/24/2017 10/17/13   Penny Pia, MD  Multiple Vitamin (MULTIVITAMIN WITH MINERALS) TABS tablet Take 1 tablet by mouth daily.    [provider]  ondansetron (ZOFRAN) 4 MG tablet Take 1 tablet (4 mg total) by mouth every 8 (eight) hours as needed for nausea or vomiting. 11/17/18   Raeford Razor, MD  potassium chloride SA (K-DUR,KLOR-CON) 20 MEQ tablet Take 1 tablet (20 mEq total) by mouth daily. Patient not taking: Reported on 12/24/2017 10/17/13   Penny Pia, MD    Allergies     Shellfish allergy  Review of Systems   Review of Systems  All other systems reviewed and are negative.   Physical Exam Updated Vital Signs BP (!) 162/98 (BP Location: Left Arm)   Pulse 99   Temp 98.7 F (37.1 C) (Oral)   Resp 17   Ht 5' 6.5" (1.689 m)   Wt 83 kg   SpO2 99%   BMI 29.09 kg/m   Physical Exam Vitals and nursing note reviewed.  Constitutional:      General: She is not in acute distress.    Appearance: She is well-developed and well-nourished. She is not diaphoretic.  HENT:     Head: Normocephalic and atraumatic.  Cardiovascular:     Rate and Rhythm: Normal rate and regular rhythm.     Heart sounds: No murmur heard. No friction rub. No gallop.   Pulmonary:     Effort: Pulmonary effort is normal. No respiratory distress.     Breath sounds: Normal breath sounds. No wheezing.  Abdominal:     General: Bowel sounds are normal. There is no distension.     Palpations: Abdomen is soft.     Tenderness: There is no abdominal tenderness.  Musculoskeletal:        General: No swelling or tenderness. Normal range of motion.     Cervical back: Normal range of motion and neck supple.     Right lower leg: No edema.     Left lower leg: No edema.  Skin:    General: Skin is warm and dry.  Neurological:     Mental Status: She is alert and oriented to person, place, and time.     ED Results / Procedures / Treatments   Labs (all labs ordered are listed, but only abnormal results are displayed) Labs Reviewed  RESP PANEL BY RT-PCR (FLU A&B, COVID) [id number]  BASIC METABOLIC PANEL  CBC WITH DIFFERENTIAL/PLATELET  BRAIN NATRIURETIC PEPTIDE  TROPONIN I (HIGH SENSITIVITY)    EKG None  Radiology DG Chest Port 1 View  Result Date: 10/14/2020 CLINICAL DATA:  50 year old female with shortness of breath. EXAM: PORTABLE CHEST 1 VIEW COMPARISON:  Chest radiograph dated 03/19/2016 FINDINGS: There is cardiomegaly with vascular congestion and mild edema. No focal consolidation,  pleural effusion or pneumothorax. No acute osseous pathology. IMPRESSION: Cardiomegaly with findings of CHF. No focal consolidation. Electronically Signed   By: Elgie Collard M.D.   On: 10/14/2020 21:23    Procedures Procedures (including critical care time)  Medications Ordered in ED Medications - No data to display  ED Course  I have reviewed the triage vital signs and the nursing notes.  Pertinent labs & imaging results that were  available during my care of the patient were reviewed by me and considered in my medical decision making (see chart for details).    MDM Rules/Calculators/A&P  Patient is a 50 year old female with history of postpartum cardiomyopathy presenting with complaints of cough and feeling short of breath.  Her daughter was diagnosed with Covid.  Patient's Covid test is positive, but chest x-ray is suggestive of CHF with a BNP of 860.  I am uncertain as to the significance of this, but will start the patient on a low-dose of Lasix.  She has seen Dr. Mayford Knife with cardiology in the past and I will refer her back to cardiology to be rechecked in the next week.  Patient to isolate at home and return as needed if her breathing worsens.  Tanya Fry was evaluated in Emergency Department on 10/15/2020 for the symptoms described in the history of present illness. She was evaluated in the context of the global COVID-19 pandemic, which necessitated consideration that the patient might be at risk for infection with the SARS-CoV-2 virus that causes COVID-19. Institutional protocols and algorithms that pertain to the evaluation of patients at risk for COVID-19 are in a state of rapid change based on information released by regulatory bodies including the CDC and federal and state organizations. These policies and algorithms were followed during the patient's care in the ED.   Final Clinical Impression(s) / ED Diagnoses Final diagnoses:  SOB (shortness of breath)    Rx /  DC Orders ED Discharge Orders    None       Geoffery Lyons, MD 10/15/20 [phone]

## 2020-11-11 NOTE — Progress Notes (Deleted)
Cardiology Office Note:    Date:  11/11/2020   ID:  Tanya Fry, DOB November 18, 1970, MRN [id number]  PCP:  Tanya Contras, MD  Cardiologist:  No primary care provider on file.  Electrophysiologist:  None   Referring MD: Tanya Contras, MD   No chief complaint on file. ***  History of Present Illness:    Tanya Fry is a 50 y.o. female with a hx of hypertension, postpartum cardiomyopathy who is referred by Dr.Avbuere for evaluation of heart failure. She was diagnosed with COVID-19 infection after presenting to ED 10/14/2020. Also noted to have elevated BNP 860 and chest x-ray suggestive of heart failure. She was started on Lasix 20 mg daily.  Past Medical History:  Diagnosis Date  . Anemia   . Hypertension    CHTN, no current meds  . Postpartum cardiomyopathy     Past Surgical History:  Procedure Laterality Date  . CERVICAL CERCLAGE    . GASTRECTOMY    . NO PAST SURGERIES    . TUBAL LIGATION Bilateral 10/03/2013   Procedure: POST PARTUM TUBAL LIGATION;  Surgeon: Tanya Newcomer, MD;  Location: [facility];  Service: Gynecology;  Laterality: Bilateral;    Current Medications: No outpatient medications have been marked as taking for the 11/12/20 encounter (Appointment) with Tanya Ishikawa, MD.     Allergies:   Shellfish allergy   Social History   Socioeconomic History  . Marital status: Single    Spouse name: Not on file  . Number of children: 4  . Years of education: Not on file  . Highest education level: Not on file  Occupational History  . Occupation: CUSTOMER SERVICE    Employer: [employer]  Tobacco Use  . Smoking status: Current Every Day Smoker    Packs/day: 0.50    Types: Cigarettes  . Smokeless tobacco: Never Used  Substance and Sexual Activity  . Alcohol use: No  . Drug use: No  . Sexual activity: Not Currently  Other Topics Concern  . Not on file  Social History Narrative   Lives at with 4 children.     Social Determinants  of Health   Financial Resource Strain: Not on file  Food Insecurity: Not on file  Transportation Needs: Not on file  Physical Activity: Not on file  Stress: Not on file  Social Connections: Not on file     Family History: The patient's ***family history includes Cancer in her maternal grandmother and paternal grandfather; Heart failure (age of onset: 76) in her father.  ROS:   Please see the history of present illness.    *** All other systems reviewed and are negative.  EKGs/Labs/Other Studies Reviewed:    The following studies were reviewed today: ***  EKG:  EKG is *** ordered today.  The ekg ordered today demonstrates ***  Recent Labs: 10/15/2020: B Natriuretic Peptide 859.4; BUN 15; Creatinine, Ser 0.96; Hemoglobin 14.8; Platelets 219; Potassium 3.4; Sodium 139  Recent Lipid Panel No results found for: CHOL, TRIG, HDL, CHOLHDL, VLDL, LDLCALC, LDLDIRECT  Physical Exam:    VS:  There were no vitals taken for this visit.    Wt Readings from Last 3 Encounters:  10/14/20 183 lb (83 kg)  11/17/18 160 lb (72.6 kg)  05/24/17 190 lb (86.2 kg)     GEN: *** Well nourished, well developed in no acute distress HEENT: Normal NECK: No JVD; No carotid bruits LYMPHATICS: No lymphadenopathy CARDIAC: ***RRR, no murmurs, rubs, gallops RESPIRATORY:  Clear to  auscultation without rales, wheezing or rhonchi  ABDOMEN: Soft, non-tender, non-distended MUSCULOSKELETAL:  No edema; No deformity  SKIN: Warm and dry NEUROLOGIC:  Alert and oriented x 3 PSYCHIATRIC:  Normal affect   ASSESSMENT:    No diagnosis found. PLAN:    In order of problems listed above:  1. ***   Medication Adjustments/Labs and Tests Ordered: Current medicines are reviewed at length with the patient today.  Concerns regarding medicines are outlined above.  No orders of the defined types were placed in this encounter.  No orders of the defined types were placed in this encounter.   There are no Patient  Instructions on file for this visit.   Signed, Tanya Ishikawa, MD  11/11/2020 9:27 PM    [facility]

## 2020-11-12 ENCOUNTER — Other Ambulatory Visit: Payer: Self-pay

## 2020-11-12 ENCOUNTER — Ambulatory Visit: Payer: Medicaid Other | Admitting: Cardiology

## 2020-12-04 ENCOUNTER — Ambulatory Visit: Payer: Medicaid Other | Admitting: Cardiology

## 2020-12-18 ENCOUNTER — Other Ambulatory Visit: Payer: Self-pay

## 2020-12-18 ENCOUNTER — Encounter: Payer: Self-pay | Admitting: Cardiology

## 2020-12-18 ENCOUNTER — Ambulatory Visit (INDEPENDENT_AMBULATORY_CARE_PROVIDER_SITE_OTHER): Payer: Medicaid Other | Admitting: Cardiology

## 2020-12-18 VITALS — BP 132/90 | HR 82 | Ht 67.0 in | Wt 184.0 lb

## 2020-12-18 DIAGNOSIS — Z7189 Other specified counseling: Secondary | ICD-10-CM

## 2020-12-18 DIAGNOSIS — I493 Ventricular premature depolarization: Secondary | ICD-10-CM

## 2020-12-18 DIAGNOSIS — O903 Peripartum cardiomyopathy: Secondary | ICD-10-CM | POA: Diagnosis not present

## 2020-12-18 DIAGNOSIS — Z8616 Personal history of COVID-19: Secondary | ICD-10-CM

## 2020-12-18 DIAGNOSIS — Z8249 Family history of ischemic heart disease and other diseases of the circulatory system: Secondary | ICD-10-CM

## 2020-12-18 DIAGNOSIS — R002 Palpitations: Secondary | ICD-10-CM | POA: Diagnosis not present

## 2020-12-18 LAB — LIPID PANEL
Chol/HDL Ratio: 1.9 ratio (ref 0.0–4.4)
Cholesterol, Total: 152 mg/dL (ref 100–199)
HDL: 80 mg/dL (ref >39)
LDL Chol Calc (NIH): 57 mg/dL (ref 0–99)
Triglycerides: 81 mg/dL (ref 0–149)
VLDL Cholesterol Cal: 15 mg/dL (ref 5–40)

## 2020-12-18 NOTE — Patient Instructions (Signed)
Medication Instructions:  Your Physician recommend you continue on your current medication as directed.    *If you need a refill on your cardiac medications before your next appointment, please call your pharmacy*   Lab Work: Your physician recommends lab work today (Lipid)   If you have labs (blood work) drawn today and your tests are completely normal, you will receive your results only by: Marland Kitchen MyChart Message (if you have MyChart) OR . A paper copy in the mail If you have any lab test that is abnormal or we need to change your treatment, we will call you to review the results.   Testing/Procedures: Your physician has requested that you have an echocardiogram. Echocardiography is a painless test that uses sound waves to create images of your heart. It provides your doctor with information about the size and shape of your heart and how well your heart's chambers and valves are working. This procedure takes approximately one hour. There are no restrictions for this procedure. [address]    Follow-Up: At [facility], you and your health needs are our priority.  As part of our continuing mission to provide you with exceptional heart care, we have created designated Provider Care Teams.  These Care Teams include your primary Cardiologist (physician) and Advanced Practice Providers (APPs -  Physician Assistants and Nurse Practitioners) who all work together to provide you with the care you need, when you need it.  We recommend signing up for the patient portal called "MyChart".  Sign up information is provided on this After Visit Summary.  MyChart is used to connect with patients for Virtual Visits (Telemedicine).  Patients are able to view lab/test results, encounter notes, upcoming appointments, etc.  Non-urgent messages can be sent to your provider as well.   To learn more about what you can do with MyChart, go to [email or web address].    Your next appointment:    2 year(s)  The format for your next appointment:   In Person  Provider:   Jodelle Red, MD

## 2020-12-18 NOTE — Progress Notes (Signed)
Cardiology Office Note:    Date:  12/18/2020   ID:  Tanya Fry, DOB [birth year], MRN [id number]  PCP:  Tanya Contras, MD  Cardiologist:  Jodelle Red, MD  Referring MD: Tanya Contras, MD   CC: new patient consultation for postpartum cardiomyopathy  History of Present Illness:    Tanya Fry is a 50 y.o. female with a hx of hypertension, postpartum cardiomyopathy, Covid infection who is seen as a new consult at the request of Tanya Contras, MD for the evaluation and management of history of heart failure. She was last seen by Dr. Shirlee Fry in 2015 in the hospital.  Referred back to cardiology as she was having shortness of breath with Covid. BNP elevated on evaluation. Had shortness of breath for about 3 weeks. No swelling, doesn't routinely weigh herself at home. Had no issues before Covid. Can usually walk up stairs, be active, but with Covid had shortness of breath with this, now resolved. Sent home with oral lasix, now off of this. No symptoms once lasix stopped.  Has not been on medication for blood pressure. Had gastric sleeve surgery 3 years ago, has not required meds since that time.  Has palpitations, worse with Covid. Now improved, no severe episodes in the last month. Can feel the PVCs, noted when getting ECG today.   Denies chest pain, shortness of breath at rest. No PND, orthopnea, LE edema or unexpected weight gain. No syncope.  Father died 4 years ago from heart failure. Tanya Fry had MI in her 63s and died. Tanya Fry died of MI.   No recent cholesterol numbers. S/p gastric sleeve, eats a lot of chicken, uses air fryer. Not a lot of fried food, minimal grains. Stays active every day, used to go to the gym but has been less active since Covid.   Past Medical History:  Diagnosis Date   Anemia    Hypertension    CHTN, no current meds   Postpartum cardiomyopathy     Past Surgical History:  Procedure Laterality Date   CERVICAL CERCLAGE     GASTRECTOMY      NO PAST SURGERIES     TUBAL LIGATION Bilateral 10/03/2013   Procedure: POST PARTUM TUBAL LIGATION;  Surgeon: Tanya Newcomer, MD;  Location: [facility];  Service: Gynecology;  Laterality: Bilateral;    Current Medications: Current Outpatient Medications on File Prior to Visit  Medication Sig   acetaminophen (TYLENOL) 500 MG tablet Take 1,000 mg by mouth every 6 (six) hours as needed for mild pain.    Cholecalciferol (VITAMIN D3) 2000 units capsule Take 1 capsule by mouth daily.   diphenhydrAMINE (BENADRYL) 25 MG tablet Take 25 mg by mouth every 6 (six) hours as needed for allergies.   No current facility-administered medications on file prior to visit.     Allergies:   Shellfish allergy   Social History   Tobacco Use   Smoking status: Current Every Day Smoker    Packs/day: 0.50    Types: Cigarettes   Smokeless tobacco: Never Used  Substance Use Topics   Alcohol use: No   Drug use: No    Family History: family history includes Cancer in her maternal grandmother and paternal grandfather; Heart failure (age of onset: 16) in her father.  ROS:   Please see the history of present illness.  Additional pertinent ROS: Constitutional: Negative for chills, fever, night sweats, unintentional weight loss  HENT: Negative for ear pain and hearing loss.   Eyes: Negative for loss  of vision and eye pain.  Respiratory: Negative for cough, sputum, wheezing.   Cardiovascular: See HPI. Gastrointestinal: Negative for abdominal pain, melena, and hematochezia.  Genitourinary: Negative for dysuria and hematuria.  Musculoskeletal: Negative for falls and myalgias.  Skin: Negative for itching and rash.  Neurological: Negative for focal weakness, focal sensory changes and loss of consciousness.  Endo/Heme/Allergies: Does not bruise/bleed easily.     EKGs/Labs/Other Studies Reviewed:    The following studies were reviewed today: Echo 10/15/2013 - Left ventricle: The cavity size was normal. Wall  thickness    was normal. Systolic function was normal. The estimated    ejection fraction was in the range of 55% to 60%. Wall    motion was normal; there were no regional wall motion    abnormalities. Doppler parameters are consistent with    abnormal left ventricular relaxation (grade 1 diastolic    dysfunction). The E/e' ratio is >10, suggesting elevated    LV filling pressure.  - Mitral valve: Mildly thickened leaflets . Mild    regurgitation.  - Left atrium: The atrium wasmoderately dilated (35 ml/m2).  - Right atrium: The atrium was mildly dilated (19 cm2).  - Inferior vena cava: The vessel was normal in size; the    respirophasic diameter changes were in the normal range (=    50%); findings are consistent with normal central venous    pressure.  - Pericardium, extracardiac: There was no pericardial    effusion.   EKG:  EKG is personally reviewed.  The ekg ordered today demonstrates sinus rhythm with PVCs in quadrigeminy, rate 82 bpm  Recent Labs: 10/15/2020: B Natriuretic Peptide 859.4; BUN 15; Creatinine, Ser 0.96; Hemoglobin 14.8; Platelets 219; Potassium 3.4; Sodium 139  Recent Lipid Panel No results found for: CHOL, TRIG, HDL, CHOLHDL, VLDL, LDLCALC, LDLDIRECT  Physical Exam:    VS:  BP 132/90 (BP Location: Left Arm, Patient Position: Sitting, Cuff Size: Large)   Pulse 82   Ht 5\' 7"  (1.702 m)   Wt 184 lb (83.5 kg)   BMI 28.82 kg/m     Wt Readings from Last 3 Encounters:  12/18/20 184 lb (83.5 kg)  10/14/20 183 lb (83 kg)  11/17/18 160 lb (72.6 kg)    GEN: Well nourished, well developed in no acute distress HEENT: Normal, moist mucous membranes NECK: No JVD CARDIAC: regular rhythm with every 4th beat premature, normal S1 and S2, no rubs or gallops. No murmur. VASCULAR: Radial and DP pulses 2+ bilaterally. No carotid bruits RESPIRATORY:  Clear to auscultation without rales, wheezing or rhonchi  ABDOMEN: Soft, non-tender, non-distended MUSCULOSKELETAL:  Ambulates  independently SKIN: Warm and dry, no edema NEUROLOGIC:  Alert and oriented x 3. No focal neuro deficits noted. PSYCHIATRIC:  Normal affect    ASSESSMENT:    1. Peripartum cardiomyopathy   2. Personal history of COVID-19   3. PVC (premature ventricular contraction)   4. Heart palpitations   5. Family history of heart disease   6. Cardiac risk counseling    PLAN:    History of peripartum cardiomyopathy History of Covid -will check echocardiogram  Palpitations PVCs -discussed ECG today -discussed monitor for PVC burden, will order. Would like to minimize medications if possible  Cardiac risk counseling and prevention recommendations: has family history of heart disease -recommend heart healthy/Mediterranean diet, with whole grains, fruits, vegetable, fish, lean meats, nuts, and olive oil. Limit salt. -recommend moderate walking, 3-5 times/week for 30-50 minutes each session. Aim for at least 150 minutes.week. Goal  should be pace of 3 miles/hours, or walking 1.5 miles in 30 minutes -recommend avoidance of tobacco products. Avoid excess alcohol. -ASCVD risk score: The ASCVD Risk score Denman George DC Jr., et al., 2013) failed to calculate for the following reasons:   Cannot find a previous HDL lab   Cannot find a previous total cholesterol lab    Plan for follow up: if echo unremarkable, follow up in 2 years or sooner as needed  Jodelle Red, MD, PhD, [facility]  [facility]    Medication Adjustments/Labs and Tests Ordered: Current medicines are reviewed at length with the patient today.  Concerns regarding medicines are outlined above.  Orders Placed This Encounter  Procedures   Lipid panel   EKG 12-Lead   ECHOCARDIOGRAM COMPLETE   No orders of the defined types were placed in this encounter.   Patient Instructions  Medication Instructions:  Your Physician recommend you continue on your current medication as directed.    *If you need a refill on your  cardiac medications before your next appointment, please call your pharmacy*   Lab Work: Your physician recommends lab work today (Lipid)   If you have labs (blood work) drawn today and your tests are completely normal, you will receive your results only by: MyChart Message (if you have MyChart) OR A paper copy in the mail If you have any lab test that is abnormal or we need to change your treatment, we will call you to review the results.   Testing/Procedures: Your physician has requested that you have an echocardiogram. Echocardiography is a painless test that uses sound waves to create images of your heart. It provides your doctor with information about the size and shape of your heart and how well your heart's chambers and valves are working. This procedure takes approximately one hour. There are no restrictions for this procedure. [address]    Follow-Up: At [facility], you and your health needs are our priority.  As part of our continuing mission to provide you with exceptional heart care, we have created designated Provider Care Teams.  These Care Teams include your primary Cardiologist (physician) and Advanced Practice Providers (APPs -  Physician Assistants and Nurse Practitioners) who all work together to provide you with the care you need, when you need it.  We recommend signing up for the patient portal called "MyChart".  Sign up information is provided on this After Visit Summary.  MyChart is used to connect with patients for Virtual Visits (Telemedicine).  Patients are able to view lab/test results, encounter notes, upcoming appointments, etc.  Non-urgent messages can be sent to your provider as well.   To learn more about what you can do with MyChart, go to [email or web address].    Your next appointment:   2 year(s)  The format for your next appointment:   In Person  Provider:   Jodelle Red, MD  Signed, Jodelle Red, MD  PhD 12/18/2020     [facility]

## 2021-01-09 ENCOUNTER — Ambulatory Visit (HOSPITAL_COMMUNITY): Payer: Medicaid Other | Attending: Cardiovascular Disease

## 2021-01-09 ENCOUNTER — Other Ambulatory Visit: Payer: Self-pay

## 2021-01-09 DIAGNOSIS — I429 Cardiomyopathy, unspecified: Secondary | ICD-10-CM | POA: Diagnosis not present

## 2021-01-09 DIAGNOSIS — I340 Nonrheumatic mitral (valve) insufficiency: Secondary | ICD-10-CM | POA: Diagnosis not present

## 2021-01-09 DIAGNOSIS — Z8616 Personal history of COVID-19: Secondary | ICD-10-CM | POA: Diagnosis not present

## 2021-01-09 DIAGNOSIS — I493 Ventricular premature depolarization: Secondary | ICD-10-CM | POA: Diagnosis not present

## 2021-01-09 DIAGNOSIS — R002 Palpitations: Secondary | ICD-10-CM

## 2021-01-09 DIAGNOSIS — I10 Essential (primary) hypertension: Secondary | ICD-10-CM | POA: Insufficient documentation

## 2021-01-09 DIAGNOSIS — O903 Peripartum cardiomyopathy: Secondary | ICD-10-CM

## 2021-01-09 LAB — ECHOCARDIOGRAM COMPLETE
Area-P 1/2: 2.83 cm2
S' Lateral: 4.8 cm

## 2021-01-16 ENCOUNTER — Ambulatory Visit (INDEPENDENT_AMBULATORY_CARE_PROVIDER_SITE_OTHER): Payer: Medicaid Other

## 2021-01-16 ENCOUNTER — Other Ambulatory Visit: Payer: Self-pay

## 2021-01-16 DIAGNOSIS — I493 Ventricular premature depolarization: Secondary | ICD-10-CM

## 2021-01-20 DIAGNOSIS — I493 Ventricular premature depolarization: Secondary | ICD-10-CM

## 2021-04-08 ENCOUNTER — Emergency Department (HOSPITAL_BASED_OUTPATIENT_CLINIC_OR_DEPARTMENT_OTHER)
Admission: EM | Admit: 2021-04-08 | Discharge: 2021-04-08 | Disposition: A | Discharge status: Home / Self Care | Payer: Medicaid Other | Attending: Emergency Medicine | Admitting: Emergency Medicine

## 2021-04-08 ENCOUNTER — Other Ambulatory Visit: Payer: Self-pay

## 2021-04-08 ENCOUNTER — Encounter (HOSPITAL_BASED_OUTPATIENT_CLINIC_OR_DEPARTMENT_OTHER): Payer: Self-pay | Admitting: Emergency Medicine

## 2021-04-08 DIAGNOSIS — F17210 Nicotine dependence, cigarettes, uncomplicated: Secondary | ICD-10-CM | POA: Diagnosis not present

## 2021-04-08 DIAGNOSIS — I110 Hypertensive heart disease with heart failure: Secondary | ICD-10-CM | POA: Insufficient documentation

## 2021-04-08 DIAGNOSIS — H1032 Unspecified acute conjunctivitis, left eye: Secondary | ICD-10-CM | POA: Insufficient documentation

## 2021-04-08 DIAGNOSIS — H60501 Unspecified acute noninfective otitis externa, right ear: Secondary | ICD-10-CM | POA: Diagnosis not present

## 2021-04-08 DIAGNOSIS — H5712 Ocular pain, left eye: Secondary | ICD-10-CM | POA: Diagnosis present

## 2021-04-08 DIAGNOSIS — I509 Heart failure, unspecified: Secondary | ICD-10-CM | POA: Diagnosis not present

## 2021-04-08 MED ORDER — VALACYCLOVIR HCL 1 G PO TABS: 1000.0000 mg | ORAL_TABLET | Freq: Three times a day (TID) | ORAL | 0 refills | Status: AC

## 2021-04-08 MED ORDER — TETRACAINE HCL 0.5 % OP SOLN
OPHTHALMIC | Status: AC
  Filled 2021-04-08: qty 4

## 2021-04-08 MED ORDER — FLUORESCEIN SODIUM 1 MG OP STRP
1.0000 | ORAL_STRIP | Freq: Once | OPHTHALMIC | Status: DC
  Filled 2021-04-08: qty 1

## 2021-04-08 MED ORDER — TETRACAINE HCL 0.5 % OP SOLN
2.0000 [drp] | Freq: Once | OPHTHALMIC | Status: AC
  Administered 2021-04-08: 2 [drp] via OPHTHALMIC
  Filled 2021-04-08: qty 4

## 2021-04-08 MED ORDER — NEOMYCIN-POLYMYXIN-HC 3.5-10000-1 OT SUSP: 4.0000 [drp] | Freq: Three times a day (TID) | OTIC | 0 refills | Status: AC

## 2021-04-08 NOTE — ED Provider Notes (Signed)
[facility] EMERGENCY DEPT Provider Note   CSN: [id number] Arrival date & time: 04/08/21  0830     History Chief Complaint  Patient presents with   Eye Pain    Tanya Fry is a 50 y.o. female.  She does have a remote history of a fungal infection of her eye which was linked to fresh water exposure. No recent exposure to fresh water.  The history is provided by the patient.  Eye Pain This is a new problem. The current episode started more than 2 days ago (4 days ago). The problem occurs constantly. The problem has been gradually worsening. Pertinent negatives include no chest pain, no abdominal pain, no headaches and no shortness of breath. Exacerbated by: light. Nothing relieves the symptoms. Treatments tried: ofloxacin drops and erythromycin ointment. The treatment provided no relief.      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   History of incompetent cervix, currently pregnant 08/02/2013   HTN (hypertension), malignant 08/02/2013   Gonorrhea complicating pregnancy in third trimester 08/02/2013   Elderly multigravida with antepartum condition or complication 08/02/2013   High risk pregnancy due to history of preterm labor in third trimester 08/02/2013    Past Surgical History:  Procedure Laterality Date   CERVICAL CERCLAGE     GASTRECTOMY     NO PAST SURGERIES     TUBAL LIGATION Bilateral  10/03/2013   Procedure: POST PARTUM TUBAL LIGATION;  Surgeon: Tereso Newcomer, MD;  Location: [facility];  Service: Gynecology;  Laterality: Bilateral;     OB History     Gravida  8   Para  4   Term  3   Preterm  1   AB  4   Living  4      SAB  4   IAB      Ectopic      Multiple      Live Births  4           Family History  Problem Relation Age of Onset   Cancer Maternal Grandmother        breast cancer   Cancer Paternal Grandfather        prostate   Heart failure Father 22    Social History   Tobacco Use   Smoking status: Every Day    Packs/day: 0.50    Pack years: 0.00    Types: Cigarettes   Smokeless tobacco: Never  Substance Use Topics   Alcohol use: No   Drug use: No    Home Medications Prior to Admission medications   Medication Sig Start Date End Date Taking? Authorizing Provider  Cholecalciferol (VITAMIN D3) 2000 units capsule Take 1 capsule by mouth daily.   Yes [provider]  acetaminophen (TYLENOL) 500 MG tablet Take 1,000 mg by mouth every 6 (six) hours as needed for mild pain.     [provider]  diphenhydrAMINE (BENADRYL) 25 MG tablet Take 25 mg by  mouth every 6 (six) hours as needed for allergies.    [provider]    Allergies    Shellfish allergy  Review of Systems   Review of Systems  Constitutional:  Negative for chills and fever.  HENT:  Positive for ear pain (right). Negative for sore throat.   Eyes:  Positive for photophobia, pain, discharge and redness. Negative for visual disturbance.  Respiratory:  Negative for cough and shortness of breath.   Cardiovascular:  Negative for chest pain and palpitations.  Gastrointestinal:  Negative for abdominal pain and vomiting.  Genitourinary:  Negative for dysuria and hematuria.  Musculoskeletal:  Negative for arthralgias and back pain.  Skin:  Negative for color change and rash.  Neurological:  Negative for seizures, syncope and headaches.  All  other systems reviewed and are negative.  Physical Exam Updated Vital Signs BP (!) 151/97 (BP Location: Right Arm)   Pulse 84   Temp 98.9 F (37.2 C)   Resp 20   SpO2 100%   Physical Exam Vitals and nursing note reviewed.  HENT:     Head: Normocephalic and atraumatic.     Right Ear: Tympanic membrane, ear canal and external ear normal.     Ears:     Comments: Right external auditory canal is narrowed, inflamed, and has a serous discharge.  Narrowing of the canal makes it impossible to fully visualize the tympanic membrane. Eyes:     General: Lids are normal. No scleral icterus.    Intraocular pressure: Left eye pressure is 34 mmHg. Measurements were taken using a handheld tonometer.    Extraocular Movements: Extraocular movements intact.     Conjunctiva/sclera:     Right eye: Right conjunctiva is not injected.     Left eye: Left conjunctiva is injected.     Comments: Tiny punctate fluorescein uptake in a tiny stripe located at the inferior and lateral aspect of the cornea on the left at approximately the 5 o'clock position.  Pulmonary:     Effort: Pulmonary effort is normal. No respiratory distress.  Musculoskeletal:     Cervical back: Normal range of motion.  Skin:    General: Skin is warm and dry.  Neurological:     Mental Status: She is alert.  Psychiatric:        Mood and Affect: Mood normal.    ED Results / Procedures / Treatments   Labs (all labs ordered are listed, but only abnormal results are displayed) Labs Reviewed - No data to display  EKG None  Radiology No results found.  Procedures Procedures   Medications Ordered in ED Medications  tetracaine (PONTOCAINE) 0.5 % ophthalmic solution 2 drop (has no administration in time range)  fluorescein ophthalmic strip 1 strip (has no administration in time range)    ED Course  I have reviewed the triage vital signs and the nursing notes.  Pertinent labs & imaging results that were available during my  care of the patient were reviewed by me and considered in my medical decision making (see chart for details).  Clinical Course as of 04/08/21 1008  Tue Apr 08, 2021  1008 I spoke with Dr. Sherryll Burger. Start valtrex 1 g TID. Patient can f/u in clinic tomorrow at 8 am [AW]    Clinical Course User Index [AW] Koleen Distance, MD   MDM Rules/Calculators/A&P                          Winfield Rast  Hearns presents with 4 days of left eye pain.  She has been on ofloxacin.  I am concerned about a possible viral or fungal conjunctivitis, corneal ulcer, or other ophthalmologic infection.  Eye pressures were slightly high, and glaucoma would be on the differential.  Based on her symptoms, I do think her symptoms are more likely to be infectious.  She will be started on Valtrex per ophthalmology recommendations.  She will follow-up in clinic tomorrow.  Incidentally, she also has otitis externa of the right ear which will be treated. Final Clinical Impression(s) / ED Diagnoses Final diagnoses:  Acute conjunctivitis of left eye, unspecified acute conjunctivitis type  Acute otitis externa of right ear, unspecified type    Rx / DC Orders ED Discharge Orders          Ordered    valACYclovir (VALTREX) 1000 MG tablet  3 times daily        04/08/21 1007    neomycin-polymyxin-hydrocortisone (CORTISPORIN) 3.5-10000-1 OTIC suspension  3 times daily        04/08/21 1010             Koleen Distance, MD 04/08/21 1012

## 2021-04-08 NOTE — ED Triage Notes (Signed)
Pt has left eye pain for 4 days, pt thought she may have pink eye and used some prescription antibiotic eye gtts prescribed for her daughter. Pt states eye is more painful today, decreased vision. Only other symptoms pt can think of is her right ear is clogged.

## 2021-04-08 NOTE — ED Notes (Signed)
Pt reports vision in left eye is fuzzy.

## 2021-04-22 ENCOUNTER — Other Ambulatory Visit: Payer: Self-pay

## 2021-04-22 MED ORDER — METOPROLOL SUCCINATE ER 25 MG PO TB24: 25.0000 mg | ORAL_TABLET | Freq: Every day | ORAL | 3 refills | Status: DC

## 2021-05-28 ENCOUNTER — Encounter (HOSPITAL_BASED_OUTPATIENT_CLINIC_OR_DEPARTMENT_OTHER): Payer: Self-pay | Admitting: Obstetrics and Gynecology

## 2021-05-28 ENCOUNTER — Emergency Department (HOSPITAL_BASED_OUTPATIENT_CLINIC_OR_DEPARTMENT_OTHER): Payer: Medicaid Other | Admitting: Radiology

## 2021-05-28 ENCOUNTER — Other Ambulatory Visit: Payer: Self-pay

## 2021-05-28 ENCOUNTER — Emergency Department (HOSPITAL_BASED_OUTPATIENT_CLINIC_OR_DEPARTMENT_OTHER)
Admission: EM | Admit: 2021-05-28 | Discharge: 2021-05-28 | Disposition: A | Discharge status: Home / Self Care | Payer: Medicaid Other | Attending: Emergency Medicine | Admitting: Emergency Medicine

## 2021-05-28 DIAGNOSIS — F17210 Nicotine dependence, cigarettes, uncomplicated: Secondary | ICD-10-CM | POA: Insufficient documentation

## 2021-05-28 DIAGNOSIS — I110 Hypertensive heart disease with heart failure: Secondary | ICD-10-CM | POA: Diagnosis not present

## 2021-05-28 DIAGNOSIS — Z8616 Personal history of COVID-19: Secondary | ICD-10-CM | POA: Diagnosis not present

## 2021-05-28 DIAGNOSIS — I509 Heart failure, unspecified: Secondary | ICD-10-CM | POA: Diagnosis not present

## 2021-05-28 DIAGNOSIS — R059 Cough, unspecified: Secondary | ICD-10-CM | POA: Diagnosis present

## 2021-05-28 DIAGNOSIS — J189 Pneumonia, unspecified organism: Secondary | ICD-10-CM

## 2021-05-28 DIAGNOSIS — Z20822 Contact with and (suspected) exposure to covid-19: Secondary | ICD-10-CM | POA: Diagnosis not present

## 2021-05-28 DIAGNOSIS — R110 Nausea: Secondary | ICD-10-CM | POA: Insufficient documentation

## 2021-05-28 DIAGNOSIS — Z79899 Other long term (current) drug therapy: Secondary | ICD-10-CM | POA: Insufficient documentation

## 2021-05-28 LAB — COMPREHENSIVE METABOLIC PANEL
ALT: 10 U/L (ref 0–44)
AST: 14 U/L — ABNORMAL LOW (ref 15–41)
Albumin: 3.8 g/dL (ref 3.5–5.0)
Alkaline Phosphatase: 59 U/L (ref 38–126)
Anion gap: 9 (ref 5–15)
BUN: 9 mg/dL (ref 6–20)
CO2: 25 mmol/L (ref 22–32)
Calcium: 9 mg/dL (ref 8.9–10.3)
Chloride: 103 mmol/L (ref 98–111)
Creatinine, Ser: 0.76 mg/dL (ref 0.44–1.00)
GFR, Estimated: >60 mL/min (ref >60)
Glucose, Bld: 98 mg/dL (ref 70–99)
Potassium: 3.7 mmol/L (ref 3.5–5.1)
Sodium: 137 mmol/L (ref 135–145)
Total Bilirubin: 0.4 mg/dL (ref 0.3–1.2)
Total Protein: 6.9 g/dL (ref 6.5–8.1)

## 2021-05-28 LAB — CBC WITH DIFFERENTIAL/PLATELET
Abs Immature Granulocytes: 0.03 10*3/uL (ref 0.00–0.07)
Basophils Absolute: 0 10*3/uL (ref 0.0–0.1)
Basophils Relative: 0 %
Eosinophils Absolute: 0.1 10*3/uL (ref 0.0–0.5)
Eosinophils Relative: 1 %
HCT: 38.2 % (ref 36.0–46.0)
Hemoglobin: 12.8 g/dL (ref 12.0–15.0)
Immature Granulocytes: 0 %
Lymphocytes Relative: 14 %
Lymphs Abs: 1.1 10*3/uL (ref 0.7–4.0)
MCH: 31.5 pg (ref 26.0–34.0)
MCHC: 33.5 g/dL (ref 30.0–36.0)
MCV: 94.1 fL (ref 80.0–100.0)
Monocytes Absolute: 0.7 10*3/uL (ref 0.1–1.0)
Monocytes Relative: 9 %
Neutro Abs: 6.3 10*3/uL (ref 1.7–7.7)
Neutrophils Relative %: 76 %
Platelets: 213 10*3/uL (ref 150–400)
RBC: 4.06 MIL/uL (ref 3.87–5.11)
RDW: 13.7 % (ref 11.5–15.5)
WBC: 8.2 10*3/uL (ref 4.0–10.5)
nRBC: 0 % (ref 0.0–0.2)

## 2021-05-28 LAB — RESP PANEL BY RT-PCR (FLU A&B, COVID) ARPGX2
Influenza A by PCR: NEGATIVE
Influenza B by PCR: NEGATIVE
SARS Coronavirus 2 by RT PCR: NEGATIVE

## 2021-05-28 MED ORDER — DOXYCYCLINE HYCLATE 100 MG PO TABS
100.0000 mg | ORAL_TABLET | Freq: Once | ORAL | Status: AC
  Administered 2021-05-28: 100 mg via ORAL
  Filled 2021-05-28: qty 1

## 2021-05-28 MED ORDER — ACETAMINOPHEN 500 MG PO TABS
ORAL_TABLET | ORAL | Status: AC
  Administered 2021-05-28: 1000 mg via ORAL
  Filled 2021-05-28: qty 2

## 2021-05-28 MED ORDER — ONDANSETRON HCL 4 MG PO TABS: 4.0000 mg | ORAL_TABLET | Freq: Three times a day (TID) | ORAL | 0 refills | Status: DC | PRN

## 2021-05-28 MED ORDER — DOXYCYCLINE HYCLATE 100 MG PO CAPS: 100.0000 mg | ORAL_CAPSULE | Freq: Two times a day (BID) | ORAL | 0 refills | Status: DC

## 2021-05-28 MED ORDER — DOXYCYCLINE HYCLATE 100 MG PO CAPS: 100.0000 mg | ORAL_CAPSULE | Freq: Two times a day (BID) | ORAL | 0 refills | Status: AC

## 2021-05-28 MED ORDER — ACETAMINOPHEN 500 MG PO TABS: 1000.0000 mg | ORAL_TABLET | Freq: Once | ORAL | Status: AC

## 2021-05-28 NOTE — Discharge Instructions (Addendum)
You have been seen and discharged from the emergency department.  Your work-up today is significant for pneumonia.  Take antibiotic as directed, you were given your first dose in the department. Start regimen tomorrow morning.  Stay well-hydrated, use nausea medicine as needed.  Take Tylenol ibuprofen for pain/fever control.  Follow-up with your primary provider for reevaluation and further care. Take home medications as prescribed. If you have any worsening symptoms or further concerns for your health please return to an emergency department for further evaluation.

## 2021-05-28 NOTE — ED Provider Notes (Signed)
[facility] EMERGENCY DEPT Provider Note   CSN: [id number] Arrival date & time: 05/28/21  1526     History Chief Complaint  Patient presents with   Shortness of Breath    Tanya Fry is a 50 y.o. female.  HPI  50 year old female with past medical history of HTN, anemia presents the emergency department productive cough, body aches and fever.  She states that her sisters have the same symptoms.'s been going on for the past 2 days.  She is had mild nausea but denies any vomiting/diarrhea.  No acute chest pain.  No swelling of her lower extremities.  No recent flu/COVID sick contacts.  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   History of incompetent cervix, currently pregnant 08/02/2013   HTN (hypertension), malignant 08/02/2013   Gonorrhea complicating pregnancy in third trimester 08/02/2013   Elderly multigravida with antepartum condition or complication 08/02/2013   High risk pregnancy due to history of preterm labor in third trimester 08/02/2013    Past Surgical History:  Procedure Laterality Date   CERVICAL CERCLAGE     GASTRECTOMY     NO PAST SURGERIES     TUBAL LIGATION Bilateral 10/03/2013   Procedure: POST PARTUM TUBAL LIGATION;  Surgeon: Tereso Newcomer, MD;  Location: [facility];  Service: Gynecology;  Laterality: Bilateral;     OB History     Gravida  8   Para  4   Term  3   Preterm   1   AB  4   Living  4      SAB  4   IAB      Ectopic      Multiple      Live Births  4           Family History  Problem Relation Age of Onset   Cancer Maternal Grandmother        breast cancer   Cancer Paternal Grandfather        prostate   Heart failure Father 71    Social History   Tobacco Use   Smoking status: Every Day    Packs/day: 0.50    Types: Cigarettes   Smokeless tobacco: Never  Vaping Use   Vaping Use: Never used  Substance Use Topics   Alcohol use: No   Drug use: No    Home Medications Prior to Admission medications   Medication Sig Start Date End Date Taking? Authorizing Provider  doxycycline (VIBRAMYCIN) 100 MG capsule Take 1 capsule (100 mg total) by mouth 2 (two) times daily for 7 days. 05/28/21 06/04/21 Yes Tahirah Sara, Clabe Seal, DO  ondansetron (ZOFRAN) 4 MG tablet Take 1 tablet (4 mg total) by mouth every 8 (eight) hours as needed for nausea or vomiting. 05/28/21  Yes Benjiman Sedgwick, Clabe Seal, DO  Cholecalciferol (VITAMIN D3) 2000 units capsule Take 1 capsule by mouth daily.    [provider]  metoprolol succinate (TOPROL-XL) 25 MG 24 hr tablet Take  1 tablet (25 mg total) by mouth daily. 04/22/21   Jodelle Red, MD  valACYclovir (VALTREX) 1000 MG tablet Take 1 tablet (1,000 mg total) by mouth 3 (three) times daily. 04/08/21   Koleen Distance, MD    Allergies    Shellfish allergy  Review of Systems   Review of Systems  Constitutional:  Positive for appetite change, fatigue and fever. Negative for chills.  HENT:  Negative for congestion.   Eyes:  Negative for visual disturbance.  Respiratory:  Positive for cough. Negative for shortness of breath.   Cardiovascular:  Negative for chest pain, palpitations and leg swelling.  Gastrointestinal:  Positive for nausea. Negative for abdominal pain, diarrhea and vomiting.  Genitourinary:  Negative for dysuria.  Skin:  Negative for rash.  Neurological:  Negative for headaches.    Physical Exam Updated Vital Signs BP (!) 163/106 (BP Location: Right Arm)   Pulse 100   Temp (!) 102 F (38.9 C) (Oral) Comment: RN notified  Resp 16   LMP  (LMP Unknown)   SpO2 99%   Physical Exam Vitals and nursing note reviewed.  Constitutional:      General: She is not in acute distress.    Appearance: Normal appearance.  HENT:     Head: Normocephalic.     Mouth/Throat:     Mouth: Mucous membranes are moist.  Cardiovascular:     Rate and Rhythm: Normal rate.  Pulmonary:     Effort: Pulmonary effort is normal. No tachypnea, accessory muscle usage or respiratory distress.     Breath sounds: Examination of the right-lower field reveals decreased breath sounds. Examination of the left-lower field reveals decreased breath sounds. Decreased breath sounds present.  Abdominal:     Palpations: Abdomen is soft.     Tenderness: There is no abdominal tenderness.  Skin:    General: Skin is warm.  Neurological:     Mental Status: She is alert and oriented to person, place, and time. Mental status is at baseline.  Psychiatric:        Mood and Affect: Mood normal.    ED Results / Procedures / Treatments   Labs (all labs ordered are listed, but only abnormal results are displayed) Labs Reviewed  COMPREHENSIVE METABOLIC PANEL - Abnormal; Notable for the following components:      Result Value   AST 14 (*)    All other components within normal limits  RESP PANEL BY RT-PCR (FLU A&B, COVID) ARPGX2  CBC WITH DIFFERENTIAL/PLATELET    EKG None  Radiology DG Chest 2 View  Result Date: 05/28/2021 CLINICAL DATA:  Shortness of breath and fever EXAM: CHEST - 2 VIEW COMPARISON:  10/14/2020 FINDINGS: Cardiac shadow is within normal limits. The lungs are well aerated bilaterally. Patchy airspace opacity is noted in the left lung base projecting in the anterior aspect of the left lower lobe on the lateral film. No other focal abnormality is seen. IMPRESSION: Left basilar infiltrate  consistent with acute pneumonia. Electronically Signed   By: Alcide Clever M.D.   On: 05/28/2021 17:30    Procedures Procedures   Medications Ordered in ED Medications  doxycycline (VIBRA-TABS) tablet 100 mg (has no administration in time range)  acetaminophen (TYLENOL) tablet 1,000 mg (1,000 mg Oral Given 05/28/21 1852)    ED Course  I have reviewed the triage vital signs and the nursing notes.  Pertinent labs & imaging results that were available during my care of the patient were reviewed by me and considered in my  medical decision making (see chart for details).    MDM Rules/Calculators/A&P                           50 year old female presents emergency department with productive cough, body aches and fever.  She is febrile on arrival, no acute distress, no hypoxia.  Work-up here is significant for pneumonia.  Flu and COVID are negative.  Patient is able to tolerate p.o. and otherwise well-appearing.  Plan for antibiotics and outpatient follow-up.  Patient at this time appears safe and stable for discharge and will be treated as an outpatient.  Discharge plan and strict return to ED precautions discussed, patient verbalizes understanding and agreement.  Final Clinical Impression(s) / ED Diagnoses Final diagnoses:  Community acquired pneumonia, unspecified laterality    Rx / DC Orders ED Discharge Orders          Ordered    doxycycline (VIBRAMYCIN) 100 MG capsule  2 times daily        05/28/21 2015    ondansetron (ZOFRAN) 4 MG tablet  Every 8 hours PRN        05/28/21 2015             Jermeka Schlotterbeck, Clabe Seal, DO 05/28/21 2021

## 2021-05-28 NOTE — ED Notes (Signed)
This RN presented the AVS utilizing Teachback Method. Patient verbalizes understanding of Discharge Instructions. Opportunity for Questioning and Answers were provided. Patient Discharged from ED ambulatory to home.   

## 2021-05-28 NOTE — ED Triage Notes (Signed)
Patient reports to the ER for fever since Saturday, congestion, and ShOB. Patient reports her sisters are also sick and they have been diagnosed with URI's. Patient also reports headache

## 2021-06-19 ENCOUNTER — Telehealth: Payer: Medicaid Other | Admitting: Physician Assistant

## 2021-06-19 DIAGNOSIS — Z8679 Personal history of other diseases of the circulatory system: Secondary | ICD-10-CM

## 2021-06-19 DIAGNOSIS — U071 COVID-19: Secondary | ICD-10-CM

## 2021-06-19 DIAGNOSIS — R0602 Shortness of breath: Secondary | ICD-10-CM

## 2021-06-19 NOTE — Patient Instructions (Signed)
   For an urgent face to face visit, Tanya Fry has six urgent care centers for your convenience:     [facility] [place] Get Driving Directions [phone] [address] [place], [place] [address]    [facility] ([place] Get Driving Directions [phone] [address] [place], [place] [address]  [facility] [facility] [place]) Get Driving Directions [phone] [address] [place],  [place]  [address]  [facility] Get Driving Directions [phone] [address] [place], [place] [address]   [facility] [place] Get Driving Directions  [phone] [address] [place], [place] [address]   [facility] Get Driving Directions [phone] [address] [place], [place] [address]  

## 2021-06-19 NOTE — Progress Notes (Signed)
Virtual Visit Consent   Tanya Fry, you are scheduled for a virtual visit with a [facility] provider today.     Just as with appointments in the office, your consent must be obtained to participate.  Your consent will be active for this visit and any virtual visit you may have with one of our providers in the next 365 days.     If you have a MyChart account, a copy of this consent can be sent to you electronically.  All virtual visits are billed to your insurance company just like a traditional visit in the office.    As this is a virtual visit, video technology does not allow for your provider to perform a traditional examination.  This may limit your provider's ability to fully assess your condition.  If your provider identifies any concerns that need to be evaluated in person or the need to arrange testing (such as labs, EKG, etc.), we will make arrangements to do so.     Although advances in technology are sophisticated, we cannot ensure that it will always work on either your end or our end.  If the connection with a video visit is poor, the visit may have to be switched to a telephone visit.  With either a video or telephone visit, we are not always able to ensure that we have a secure connection.     I need to obtain your verbal consent now.   Are you willing to proceed with your visit today?    Tanya Fry has provided verbal consent on 06/19/2021 for a virtual visit (video or telephone).   Margaretann Loveless, PA-C   Date: 06/19/2021 12:04 PM   Virtual Visit via Video Note   I, Margaretann Loveless, connected with  Tanya Fry  ([id number], [birth year]) on 06/19/21 at 12:00 PM EDT by a video-enabled telemedicine application and verified that I am speaking with the correct person using two identifiers.  Location: Patient: Virtual Visit Location Patient: Home Provider: Virtual Visit Location Provider: Home Office   I discussed the limitations of evaluation and management  by telemedicine and the availability of in person appointments. The patient expressed understanding and agreed to proceed.    History of Present Illness: Tanya Fry is a 50 y.o. who identifies as a female who was assigned female at birth, and is being seen today for possible CHF exacerbation. She tested positive for Covid 19 on Monday. Reports she has been resting but today has noticed she is feeling more SOB. She has had covid in the past and reports it caused a CHF flare the last time she had it and she had to be seen in the ER. She does have a cardiologist, but is not scheduled for a f/u until Oct 2022. She denies any leg swelling, but does report last time it was noted on CXR and she did not have swelling then. She is also recovering from a recent bout with pneumonia just 3 weeks ago. She is not having any significant SOB (able to speak in full sentences) but notes becoming winded with activity.   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   History of incompetent cervix, currently pregnant 08/02/2013   HTN (hypertension), malignant 08/02/2013   Gonorrhea complicating pregnancy in third trimester 08/02/2013   Elderly multigravida with antepartum condition or complication 08/02/2013   High risk pregnancy due to history of preterm labor in third trimester 08/02/2013    Allergies:  Allergies  Allergen Reactions   Shellfish Allergy Anaphylaxis   Medications:  Current Outpatient Medications:    Cholecalciferol (VITAMIN D3) 2000 units capsule,  Take 1 capsule by mouth daily., Disp: , Rfl:    metoprolol succinate (TOPROL-XL) 25 MG 24 hr tablet, Take 1 tablet (25 mg total) by mouth daily., Disp: 30 tablet, Rfl: 3   ondansetron (ZOFRAN) 4 MG tablet, Take 1 tablet (4 mg total) by mouth every 8 (eight) hours as needed for nausea or vomiting., Disp: 4 tablet, Rfl: 0   valACYclovir (VALTREX) 1000 MG tablet, Take 1 tablet (1,000 mg total) by mouth 3 (three) times daily., Disp: 21 tablet, Rfl: 0  Observations/Objective: Patient is well-developed, well-nourished in no acute distress.  Resting comfortably at home.  Head is normocephalic, atraumatic.  No labored breathing.  Speech is clear and coherent with logical content.  Patient is alert and oriented at baseline.    Assessment and Plan: 1. COVID-19  2. Shortness of breath  3. H/O CHF  - Patient high risk for severe complications - Advised to proceed to local UC for further evaluation and possible CXR - She agrees and voiced understanding  Follow Up Instructions: I discussed the assessment and treatment plan with the patient. The patient was provided an opportunity to ask questions and all were answered. The patient agreed with the plan and demonstrated an understanding of the instructions.  A copy of instructions were sent to the patient via MyChart.  The patient was advised to call back or seek an in-person evaluation if the symptoms worsen or if the condition fails to improve as anticipated.  Time:  I spent 10 minutes with the patient via telehealth technology discussing the above problems/concerns.    Margaretann Loveless, PA-C

## 2021-07-03 ENCOUNTER — Telehealth: Payer: Medicaid Other | Admitting: Physician Assistant

## 2021-07-03 DIAGNOSIS — R519 Headache, unspecified: Secondary | ICD-10-CM

## 2021-07-03 NOTE — Patient Instructions (Signed)
  Tanya Fry, thank you for joining Piedad Climes, PA-C for today's virtual visit.  While this provider is not your primary care provider (PCP), if your PCP is located in our provider database this encounter information will be shared with them immediately following your visit.  Consent: (Patient) Tanya Fry provided verbal consent for this virtual visit at the beginning of the encounter.  Current Medications:  Current Outpatient Medications:    Cholecalciferol (VITAMIN D3) 2000 units capsule, Take 1 capsule by mouth daily., Disp: , Rfl:    metoprolol succinate (TOPROL-XL) 25 MG 24 hr tablet, Take 1 tablet (25 mg total) by mouth daily., Disp: 30 tablet, Rfl: 3   ondansetron (ZOFRAN) 4 MG tablet, Take 1 tablet (4 mg total) by mouth every 8 (eight) hours as needed for nausea or vomiting., Disp: 4 tablet, Rfl: 0   valACYclovir (VALTREX) 1000 MG tablet, Take 1 tablet (1,000 mg total) by mouth 3 (three) times daily., Disp: 21 tablet, Rfl: 0   Medications ordered in this encounter:  No orders of the defined types were placed in this encounter.    *If you need refills on other medications prior to your next appointment, please contact your pharmacy*  Follow-Up: Call back or seek an in-person evaluation if the symptoms worsen or if the condition fails to improve as anticipated.  Other Instructions Contact your Primary Care office as soon as they open to get an appointment for today. If unable to be evaluated there, I do recommend being seen at ER giving multiple episodes of this, most recently last night for head imaging to make doubly sure nothing more acutely concerning. Most likely will need a further evaluation with your regular provider/Neurology.  If there is any recurrence of symptoms today -- ER.    If you have been instructed to have an in-person evaluation today at a local Urgent Care facility, please use the link below. It will take you to a list of all of our  available [facility], including address, phone number and hours of operation. Please do not delay care.  [facility]  If you or a family member do not have a primary care provider, use the link below to schedule a visit and establish care. When you choose a [facility] primary care physician or advanced practice provider, you gain a long-term partner in health. Find a Primary Care Provider  Learn more about [facility]'s in-office and virtual care options: [facility] - Get Care Now

## 2021-07-03 NOTE — Progress Notes (Signed)
Virtual Visit Consent   Tanya Fry, you are scheduled for a virtual visit with a [facility] provider today.     Just as with appointments in the office, your consent must be obtained to participate.  Your consent will be active for this visit and any virtual visit you may have with one of our providers in the next 365 days.     If you have a MyChart account, a copy of this consent can be sent to you electronically.  All virtual visits are billed to your insurance company just like a traditional visit in the office.    As this is a virtual visit, video technology does not allow for your provider to perform a traditional examination.  This may limit your provider's ability to fully assess your condition.  If your provider identifies any concerns that need to be evaluated in person or the need to arrange testing (such as labs, EKG, etc.), we will make arrangements to do so.     Although advances in technology are sophisticated, we cannot ensure that it will always work on either your end or our end.  If the connection with a video visit is poor, the visit may have to be switched to a telephone visit.  With either a video or telephone visit, we are not always able to ensure that we have a secure connection.     I need to obtain your verbal consent now.   Are you willing to proceed with your visit today?    Tanya Fry has provided verbal consent on 07/03/2021 for a virtual visit (video or telephone).   Piedad Climes, [place]   Date: 07/03/2021 7:54 AM   Virtual Visit via Video Note   I, Piedad Climes, connected with  Tanya Fry  ([id number], Mar 08, 1971) on 07/03/21 at  7:45 AM EDT by a video-enabled telemedicine application and verified that I am speaking with the correct person using two identifiers.  Location: Patient: Virtual Visit Location Patient: Home Provider: Virtual Visit Location Provider: Home Office   I discussed the limitations of evaluation and  management by telemedicine and the availability of in person appointments. The patient expressed understanding and agreed to proceed.    History of Present Illness: Tanya Fry is a 50 y.o. who identifies as a female who was assigned female at birth, and is being seen today for recurring episodes of headache, centered around her R eye. Headache wakes her from sleep with severe pain and lasting about an hour. States she cannot get comfortable. Denies any noted vision changes. Denies runny nose but does note mild tearing during these episodes. Denies photophobia. Will note some residual tenderness.. Last episode was last night. Before that previous episode was just one week ago.  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   History of incompetent cervix, currently pregnant 08/02/2013   HTN (hypertension), malignant 08/02/2013   Gonorrhea complicating pregnancy in third trimester 08/02/2013   Elderly multigravida with antepartum condition or complication 08/02/2013   High risk pregnancy due to history of preterm labor in third trimester  08/02/2013    Allergies:  Allergies  Allergen Reactions   Shellfish Allergy Anaphylaxis   Medications:  Current Outpatient Medications:    Cholecalciferol (VITAMIN D3) 2000 units capsule, Take 1 capsule by mouth daily., Disp: , Rfl:    metoprolol succinate (TOPROL-XL) 25 MG 24 hr tablet, Take 1 tablet (25 mg total) by mouth daily., Disp: 30 tablet, Rfl: 3   ondansetron (ZOFRAN) 4 MG tablet, Take 1  tablet (4 mg total) by mouth every 8 (eight) hours as needed for nausea or vomiting., Disp: 4 tablet, Rfl: 0   valACYclovir (VALTREX) 1000 MG tablet, Take 1 tablet (1,000 mg total) by mouth 3 (three) times daily., Disp: 21 tablet, Rfl: 0  Observations/Objective: Patient is well-developed, well-nourished in no acute distress.  Resting comfortably at home.  Head is normocephalic, atraumatic.  No labored breathing. Speech is clear and coherent with logical content.  Patient is alert and oriented at baseline.   Assessment and Plan: 1. Severe headache Multiple episodes at this point. Most recent last night, resolving within an hour. Video Neuro exam within normal limits. She feels fine currently which is good but this needs more workup than can be provided via this visit. Recommend since asymptomatic and neuro exam grossly intact that she reach out directly to her PCP this morning for evaluation today in office as they can order imaging and refer to Neurology. If unable to be seen there, I want her to be evaluated in the ER as she needs imaging at this point. Strict ER precautions reviewed for any recurrence of symptoms before her evaluation with PCP.   Follow Up Instructions: I discussed the assessment and treatment plan with the patient. The patient was provided an opportunity to ask questions and all were answered. The patient agreed with the plan and demonstrated an understanding of the instructions.  A copy of instructions were sent to the patient via MyChart.  The patient was advised to call back or seek an in-person evaluation if the symptoms worsen or if the condition fails to improve as anticipated.  Time:  I spent 10 minutes with the patient via telehealth technology discussing the above problems/concerns.    Piedad Climes, PA-C

## 2021-07-23 ENCOUNTER — Encounter (HOSPITAL_BASED_OUTPATIENT_CLINIC_OR_DEPARTMENT_OTHER): Payer: Self-pay | Admitting: Cardiology

## 2021-07-23 ENCOUNTER — Ambulatory Visit (HOSPITAL_BASED_OUTPATIENT_CLINIC_OR_DEPARTMENT_OTHER): Payer: Medicaid Other | Admitting: Cardiology

## 2021-07-23 ENCOUNTER — Other Ambulatory Visit: Payer: Self-pay

## 2021-07-23 VITALS — BP 122/88 | HR 79 | Ht 66.5 in | Wt 171.4 lb

## 2021-07-23 DIAGNOSIS — Z79899 Other long term (current) drug therapy: Secondary | ICD-10-CM

## 2021-07-23 DIAGNOSIS — Z8616 Personal history of COVID-19: Secondary | ICD-10-CM

## 2021-07-23 DIAGNOSIS — I493 Ventricular premature depolarization: Secondary | ICD-10-CM

## 2021-07-23 DIAGNOSIS — O903 Peripartum cardiomyopathy: Secondary | ICD-10-CM

## 2021-07-23 DIAGNOSIS — I10 Essential (primary) hypertension: Secondary | ICD-10-CM | POA: Diagnosis not present

## 2021-07-23 MED ORDER — VALSARTAN 40 MG PO TABS: 40.0000 mg | ORAL_TABLET | Freq: Every day | ORAL | 3 refills | Status: DC

## 2021-07-23 NOTE — Progress Notes (Signed)
Cardiology Office Note:    Date:  08/11/2021   ID:  Tanya Fry, DOB 1971-09-03, MRN [id number]  PCP:  Tanya Ebbs, MD  Cardiologist:  Tanya Dresser, MD  Referring MD: Tanya Ebbs, MD   CC: Follow-up  History of Present Illness:    Tanya Fry is a 50 y.o. female with a hx of hypertension, postpartum cardiomyopathy, Covid infection who is seen for follow-up. I initially met her 12/18/2020 as a new consult at the request of Tanya Ebbs, MD for the evaluation and management of history of heart failure. She was last seen by Dr. Aundra Fry in 2015 in the hospital.  Referred back to cardiology as she was having shortness of breath with Covid. BNP elevated on evaluation. Had shortness of breath for about 3 weeks. No swelling, doesn't routinely weigh herself at home. Had no issues before Covid. Can usually walk up stairs, be active, but with Covid had shortness of breath with this, now resolved. Sent home with oral lasix, now off of this. No symptoms once lasix stopped.  Had not been on medication for blood pressure. Had gastric sleeve surgery 3 years ago, has not required meds since that time.  Today: Overall, she is feeling good. However, for about two weeks she was suffering from severe headaches that she associated with high blood pressure. She noted readings as high as 180/100. Her blood pressure would stay elevated for several days after her headache was treated. Lately her headaches have improved with her blood pressure under better control. Of note, the headaches seemed to begin shortly after she was infected with Covid.  Generally her breathing has been fine. Every now and then she feels like she is retaining fluid.  While on metoprolol she has had no recurrent palpitations and is tolerating it well.  She denies any chest pain, or shortness of breath. No lightheadedness, syncope, orthopnea, or PND. Also has no exertional symptoms.  Past Medical History:  Diagnosis  Date   Anemia    Hypertension    CHTN, no current meds   Postpartum cardiomyopathy     Past Surgical History:  Procedure Laterality Date   CERVICAL CERCLAGE     GASTRECTOMY     NO PAST SURGERIES     TUBAL LIGATION Bilateral 10/03/2013   Procedure: POST PARTUM TUBAL LIGATION;  Surgeon: Tanya Oman, MD;  Location: [facility];  Service: Gynecology;  Laterality: Bilateral;    Current Medications: Current Outpatient Medications on File Prior to Visit  Medication Sig   Cholecalciferol (VITAMIN D3) 2000 units capsule Take 1 capsule by mouth daily.   metoprolol succinate (TOPROL-XL) 25 MG 24 hr tablet Take 1 tablet (25 mg total) by mouth daily.   ondansetron (ZOFRAN) 4 MG tablet Take 1 tablet (4 mg total) by mouth every 8 (eight) hours as needed for nausea or vomiting.   valACYclovir (VALTREX) 1000 MG tablet Take 1 tablet (1,000 mg total) by mouth 3 (three) times daily.   No current facility-administered medications on file prior to visit.     Allergies:   Shellfish allergy   Social History   Tobacco Use   Smoking status: Every Day    Packs/day: 0.50    Types: Cigarettes   Smokeless tobacco: Never  Vaping Use   Vaping Use: Never used  Substance Use Topics   Alcohol use: No   Drug use: No    Family History: family history includes Cancer in her maternal grandmother and paternal grandfather; Heart failure (age of onset:  27) in her father. Father died 4 years ago from heart failure. Tanya Fry had MI in her 17s and died. Tanya Fry died of MI.  ROS:   Please see the history of present illness. All other systems are reviewed and negative.    EKGs/Labs/Other Studies Reviewed:    The following studies were reviewed today:  Monitor 01/30/2021: Patch Wear Time:  3 days and 7 hours    Patient had a min HR of 63 bpm, max HR of 150 bpm, and avg HR of 90 bpm. Predominant underlying rhythm was Sinus Rhythm. 1 run of Ventricular Tachycardia occurred lasting 4 beats with a max rate of  138 bpm (avg 136 bpm). 1 run of Supraventricular Tachycardia occurred lasting 6 beats with a max rate of 150 bpm (avg 138 bpm). Isolated SVEs were rare (<1.0%), andisolated VEs were frequent (13.4%).  Echo 01/09/2021:  1. Frequent PVCs makes EF assessment difficult. LVEF appears mildly  reduced ~45-50%.   2. Left ventricular ejection fraction, by estimation, is 45 to 50%. The  left ventricle has mildly decreased function. The left ventricle  demonstrates global hypokinesis. The left ventricular internal cavity size  was mildly dilated. Left ventricular  diastolic parameters are consistent with Grade I diastolic dysfunction  (impaired relaxation).   3. Right ventricular systolic function is normal. The right ventricular  size is normal. Tricuspid regurgitation signal is inadequate for assessing  PA pressure.   4. The mitral valve is grossly normal. Mild mitral valve regurgitation.  No evidence of mitral stenosis.   5. The aortic valve is tricuspid. Aortic valve regurgitation is not  visualized. No aortic stenosis is present.   6. The inferior vena cava is normal in size with greater than 50%  respiratory variability, suggesting right atrial pressure of 3 mmHg.   Comparison(s): Prior images unable to be directly viewed, comparison made  by report only. Changes from prior study are noted. The left ventricular  function is worsened.   Echo 10/15/2013 - Left ventricle: The cavity size was normal. Wall thickness    was normal. Systolic function was normal. The estimated    ejection fraction was in the range of 55% to 60%. Wall    motion was normal; there were no regional wall motion    abnormalities. Doppler parameters are consistent with    abnormal left ventricular relaxation (grade 1 diastolic    dysfunction). The E/e' ratio is >10, suggesting elevated    LV filling pressure.  - Mitral valve: Mildly thickened leaflets . Mild    regurgitation.  - Left atrium: The atrium wasmoderately  dilated (35 ml/m2).  - Right atrium: The atrium was mildly dilated (19 cm2).  - Inferior vena cava: The vessel was normal in size; the    respirophasic diameter changes were in the normal range (=    50%); findings are consistent with normal central venous    pressure.  - Pericardium, extracardiac: There was no pericardial    effusion.   EKG:  EKG is personally reviewed.   07/23/2021: not ordered today 12/18/2020: sinus rhythm with PVCs in quadrigeminy, rate 82 bpm  Recent Labs: 10/15/2020: B Natriuretic Peptide 859.4 05/28/2021: ALT 10; BUN 9; Creatinine, Ser 0.76; Hemoglobin 12.8; Platelets 213; Potassium 3.7; Sodium 137  Recent Lipid Panel    Component Value Date/Time   CHOL 152 12/18/2020 0919   TRIG 81 12/18/2020 0919   HDL 80 12/18/2020 0919   CHOLHDL 1.9 12/18/2020 0919   LDLCALC 57 12/18/2020 0919  Physical Exam:    VS:  BP 122/88   Pulse 79   Ht 5' 6.5" (1.689 m)   Wt 171 lb 6.4 oz (77.7 kg)   SpO2 99%   BMI 27.25 kg/m     Wt Readings from Last 3 Encounters:  07/23/21 171 lb 6.4 oz (77.7 kg)  12/18/20 184 lb (83.5 kg)  10/14/20 183 lb (83 kg)    GEN: Well nourished, well developed in no acute distress HEENT: Normal, moist mucous membranes NECK: No JVD CARDIAC: regular rhythm with occasional early beat, normal S1 and S2, no rubs or gallops. No murmur. VASCULAR: Radial and DP pulses 2+ bilaterally. No carotid bruits RESPIRATORY:  Clear to auscultation without rales, wheezing or rhonchi  ABDOMEN: Soft, non-tender, non-distended MUSCULOSKELETAL:  Ambulates independently SKIN: Warm and dry, no edema NEUROLOGIC:  Alert and oriented x 3. No focal neuro deficits noted. PSYCHIATRIC:  Normal affect    ASSESSMENT:    1. Primary hypertension   2. Medication management   3. Personal history of COVID-19   4. Peripartum cardiomyopathy   5. PVC (premature ventricular contraction)     PLAN:    History of peripartum cardiomyopathy History of Covid -echo noted EF  as 45-50%, but frequent PVCs limited interpretation -euvolemic, asymptomatic  Palpitations, improved PVCs -monitor showed PVC burden of 13% -started metoprolol with symptomatic improvement  Hypertension: -has been elevated at home, even when headache not present -will start valsartan, discussed home BP monitoring  Cardiac risk counseling and prevention recommendations: has family history of heart disease -recommend heart healthy/Mediterranean diet, with whole grains, fruits, vegetable, fish, lean meats, nuts, and olive oil. Limit salt. -recommend moderate walking, 3-5 times/week for 30-50 minutes each session. Aim for at least 150 minutes.week. Goal should be pace of 3 miles/hours, or walking 1.5 miles in 30 minutes -recommend avoidance of tobacco products. Avoid excess alcohol. -ASCVD risk score: The 10-year ASCVD risk score (Arnett DK, et al., 2019) is: 3.2%   Values used to calculate the score:     Age: 33 years     Sex: Female     Is Non-Hispanic African American: Yes     Diabetic: No     Tobacco smoker: Yes     Systolic Blood Pressure: 212 mmHg     Is BP treated: Yes     HDL Cholesterol: 80 mg/dL     Total Cholesterol: 152 mg/dL    Plan for follow up: 6 months or sooner as needed  Tanya Dresser, MD, PhD, [facility]    Medication Adjustments/Labs and Tests Ordered: Current medicines are reviewed at length with the patient today.  Concerns regarding medicines are outlined above.   Orders Placed This Encounter  Procedures   Basic metabolic panel    Meds ordered this encounter  Medications   valsartan (DIOVAN) 40 MG tablet    Sig: Take 1 tablet (40 mg total) by mouth daily.    Dispense:  90 tablet    Refill:  3    Patient Instructions  Medication Instructions:  START: Valsartan 40 mg daily  *If you need a refill on your cardiac medications before your next appointment, please call your pharmacy*   Lab Work: Your provider has  recommended lab work in 1 month (BMP). Please have this collected at [facility] [place]. The lab is open 8:00 am - 4:30 pm. Please avoid 12:00p - 1:00p for lunch hour. You do not need an appointment. Please go to [phone]  [facility] [address] [place], [place] [address]. This is in the Primary Care office on the 3rd floor, let them know you are there for blood work and they will direct you to the lab.   If you have labs (blood work) drawn today and your tests are completely normal, you will receive your results only by: [place] (if you have MyChart) OR A paper copy in the mail If you have any lab test that is abnormal or we need to change your treatment, we will call you to review the results.   Testing/Procedures: None ordered today   Follow-Up: At [facility], you and your health needs are our priority.  As part of our continuing mission to provide you with exceptional heart care, we have created designated Provider Care Teams.  These Care Teams include your primary Cardiologist (physician) and Advanced Practice Providers (APPs -  Physician Assistants and Nurse Practitioners) who all work together to provide you with the care you need, when you need it.  We recommend signing up for the patient portal called "MyChart".  Sign up information is provided on this After Visit Summary.  MyChart is used to connect with patients for Virtual Visits (Telemedicine).  Patients are able to view lab/test results, encounter notes, upcoming appointments, etc.  Non-urgent messages can be sent to your provider as well.   To learn more about what you can do with MyChart, go to [email or web address].    Your next appointment:   6 month(s)  The format for your next appointment:   In Person  Provider:   Buford Dresser, MD    [facility] [place],acting as a scribe for Tanya Dresser, MD.,have documented all relevant documentation on the behalf of Tanya Dresser,  MD,as directed by  Tanya Dresser, MD while in the presence of Tanya Dresser, MD.  I, Tanya Dresser, MD, have reviewed all documentation for this visit. The documentation on 08/11/21 for the exam, diagnosis, procedures, and orders are all accurate and complete.   Signed, Tanya Dresser, MD PhD 08/11/2021     [facility]

## 2021-07-23 NOTE — Patient Instructions (Signed)
Medication Instructions:  START: Valsartan 40 mg daily  *If you need a refill on your cardiac medications before your next appointment, please call your pharmacy*   Lab Work: Your provider has recommended lab work in 1 month (BMP). Please have this collected at [facility] [place]. The lab is open 8:00 am - 4:30 pm. Please avoid 12:00p - 1:00p for lunch hour. You do not need an appointment. Please go to [address] [place], [place] [address]. This is in the Primary Care office on the 3rd floor, let them know you are there for blood work and they will direct you to the lab.   If you have labs (blood work) drawn today and your tests are completely normal, you will receive your results only by: MyChart Message (if you have MyChart) OR A paper copy in the mail If you have any lab test that is abnormal or we need to change your treatment, we will call you to review the results.   Testing/Procedures: None ordered today   Follow-Up: At [facility], you and your health needs are our priority.  As part of our continuing mission to provide you with exceptional heart care, we have created designated Provider Care Teams.  These Care Teams include your primary Cardiologist (physician) and Advanced Practice Providers (APPs -  Physician Assistants and Nurse Practitioners) who all work together to provide you with the care you need, when you need it.  We recommend signing up for the patient portal called "MyChart".  Sign up information is provided on this After Visit Summary.  MyChart is used to connect with patients for Virtual Visits (Telemedicine).  Patients are able to view lab/test results, encounter notes, upcoming appointments, etc.  Non-urgent messages can be sent to your provider as well.   To learn more about what you can do with MyChart, go to [email or web address].    Your next appointment:   6 month(s)  The format for your next appointment:   In  Person  Provider:   Jodelle Red, MD

## 2021-08-11 ENCOUNTER — Encounter (HOSPITAL_BASED_OUTPATIENT_CLINIC_OR_DEPARTMENT_OTHER): Payer: Self-pay | Admitting: Cardiology

## 2021-08-18 ENCOUNTER — Encounter (HOSPITAL_BASED_OUTPATIENT_CLINIC_OR_DEPARTMENT_OTHER): Payer: Self-pay

## 2021-08-18 DIAGNOSIS — I10 Essential (primary) hypertension: Secondary | ICD-10-CM

## 2021-08-21 MED ORDER — CHLORTHALIDONE 25 MG PO TABS: 25.0000 mg | ORAL_TABLET | Freq: Every day | ORAL | 3 refills | Status: DC

## 2021-11-02 ENCOUNTER — Other Ambulatory Visit: Payer: Self-pay | Admitting: Cardiology

## 2022-01-29 ENCOUNTER — Ambulatory Visit (HOSPITAL_BASED_OUTPATIENT_CLINIC_OR_DEPARTMENT_OTHER): Payer: Medicaid Other | Admitting: Cardiology

## 2022-01-29 NOTE — Progress Notes (Incomplete)
?Cardiology Office Note:   ? ?Date:  01/29/2022  ? ?ID:  Tanya Fry, DOB 11-Jul-1971, MRN [id number] ? ?PCP:  Nolene Ebbs, MD  ?Cardiologist:  Buford Dresser, MD ? ?Referring MD: Nolene Ebbs, MD  ? ?CC: Follow-up ? ?History of Present Illness:   ? ?Tanya Fry is a 51 y.o. female with a hx of hypertension, postpartum cardiomyopathy, Covid infection who is seen for follow-up. I initially met her 12/18/2020 as a new consult at the request of Nolene Ebbs, MD for the evaluation and management of history of heart failure. She was last seen by Dr. Aundra Dubin in 2015 in the hospital. ? ?Referred back to cardiology as she was having shortness of breath with Covid. BNP elevated on evaluation. Had shortness of breath for about 3 weeks. No swelling, doesn't routinely weigh herself at home. Had no issues before Covid. Can usually walk up stairs, be active, but with Covid had shortness of breath with this, now resolved. Sent home with oral lasix, now off of this. No symptoms once lasix stopped. ? ?Had not been on medication for blood pressure. Had gastric sleeve surgery 3 years ago, has not required meds since that time. ? ?Today: ?*** ? ?She denies any chest pain, or shortness of breath. No lightheadedness, syncope, orthopnea, or PND. Also has no exertional symptoms. ? ?Past Medical History:  ?Diagnosis Date  ? Anemia   ? Hypertension   ? CHTN, no current meds  ? Postpartum cardiomyopathy   ? ? ?Past Surgical History:  ?Procedure Laterality Date  ? CERVICAL CERCLAGE    ? GASTRECTOMY    ? NO PAST SURGERIES    ? TUBAL LIGATION Bilateral 10/03/2013  ? Procedure: POST PARTUM TUBAL LIGATION;  Surgeon: Osborne Oman, MD;  Location: [facility];  Service: Gynecology;  Laterality: Bilateral;  ? ? ?Current Medications: ?Current Outpatient Medications on File Prior to Visit  ?Medication Sig  ? chlorthalidone (HYGROTON) 25 MG tablet Take 1 tablet (25 mg total) by mouth daily.  ? Cholecalciferol (VITAMIN D3) 2000 units  capsule Take 1 capsule by mouth daily.  ? metoprolol succinate (TOPROL-XL) 25 MG 24 hr tablet TAKE 1 TABLET (25 MG TOTAL) BY MOUTH DAILY.  ? ondansetron (ZOFRAN) 4 MG tablet Take 1 tablet (4 mg total) by mouth every 8 (eight) hours as needed for nausea or vomiting.  ? valACYclovir (VALTREX) 1000 MG tablet Take 1 tablet (1,000 mg total) by mouth 3 (three) times daily.  ? valsartan (DIOVAN) 40 MG tablet Take 1 tablet (40 mg total) by mouth daily.  ? ?No current facility-administered medications on file prior to visit.  ?  ? ?Allergies:   Shellfish allergy  ? ?Social History  ? ?Tobacco Use  ? Smoking status: Every Day  ?  Packs/day: 0.50  ?  Types: Cigarettes  ? Smokeless tobacco: Never  ?Vaping Use  ? Vaping Use: Never used  ?Substance Use Topics  ? Alcohol use: No  ? Drug use: No  ? ? ?Family History: ?family history includes Cancer in her maternal grandmother and paternal grandfather; Heart failure (age of onset: 15) in her father. Father died 4 years ago from heart failure. Teena Irani had MI in her 67s and died. Mat Gpa died of MI. ? ?ROS:   ?Please see the history of present illness. ?All other systems are reviewed and negative.  ? ? ?EKGs/Labs/Other Studies Reviewed:   ? ?The following studies were reviewed today: ? ?Monitor 01/30/2021: ?Patch Wear Time:  3 days and 7  hours  ?  ?Patient had a min HR of 63 bpm, max HR of 150 bpm, and avg HR of 90 bpm. Predominant underlying rhythm was Sinus Rhythm. 1 run of Ventricular Tachycardia occurred lasting 4 beats with a max rate of 138 bpm (avg 136 bpm). 1 run of Supraventricular Tachycardia occurred lasting 6 beats with a max rate of 150 bpm (avg 138 bpm). Isolated SVEs were rare (<1.0%), andisolated VEs were frequent (13.4%). ? ?Echo 01/09/2021: ? 1. Frequent PVCs makes EF assessment difficult. LVEF appears mildly  ?reduced ~45-50%.  ? 2. Left ventricular ejection fraction, by estimation, is 45 to 50%. The  ?left ventricle has mildly decreased function. The left ventricle   ?demonstrates global hypokinesis. The left ventricular internal cavity size  ?was mildly dilated. Left ventricular  ?diastolic parameters are consistent with Grade I diastolic dysfunction  ?(impaired relaxation).  ? 3. Right ventricular systolic function is normal. The right ventricular  ?size is normal. Tricuspid regurgitation signal is inadequate for assessing  ?PA pressure.  ? 4. The mitral valve is grossly normal. Mild mitral valve regurgitation.  ?No evidence of mitral stenosis.  ? 5. The aortic valve is tricuspid. Aortic valve regurgitation is not  ?visualized. No aortic stenosis is present.  ? 6. The inferior vena cava is normal in size with greater than 50%  ?respiratory variability, suggesting right atrial pressure of 3 mmHg.  ? ?Comparison(s): Prior images unable to be directly viewed, comparison made  ?by report only. Changes from prior study are noted. The left ventricular  ?function is worsened.  ? ?Echo 10/15/2013 ?- Left ventricle: The cavity size was normal. Wall thickness  ?  was normal. Systolic function was normal. The estimated  ?  ejection fraction was in the range of 55% to 60%. Wall  ?  motion was normal; there were no regional wall motion  ?  abnormalities. Doppler parameters are consistent with  ?  abnormal left ventricular relaxation (grade 1 diastolic  ?  dysfunction). The E/e' ratio is >10, suggesting elevated  ?  LV filling pressure.  ?- Mitral valve: Mildly thickened leaflets . Mild  ?  regurgitation.  ?- Left atrium: The atrium wasmoderately dilated (35 ml/m2).  ?- Right atrium: The atrium was mildly dilated (19 cm2).  ?- Inferior vena cava: The vessel was normal in size; the  ?  respirophasic diameter changes were in the normal range (=  ?  50%); findings are consistent with normal central venous  ?  pressure.  ?- Pericardium, extracardiac: There was no pericardial  ?  effusion.  ? ?EKG: EKG was not ordered today ?12/18/2020: sinus rhythm with PVCs in quadrigeminy, rate 82 bpm ? ?Recent  Labs: ?05/28/2021: ALT 10; BUN 9; Creatinine, Ser 0.76; Hemoglobin 12.8; Platelets 213; Potassium 3.7; Sodium 137  ?Recent Lipid Panel ?   ?Component Value Date/Time  ? CHOL 152 12/18/2020 0919  ? TRIG 81 12/18/2020 0919  ? HDL 80 12/18/2020 0919  ? CHOLHDL 1.9 12/18/2020 0919  ? [place] 57 12/18/2020 0919  ? ? ?Physical Exam:   ? ?VS:  There were no vitals taken for this visit.   ? ?Wt Readings from Last 3 Encounters:  ?07/23/21 171 lb 6.4 oz (77.7 kg)  ?12/18/20 184 lb (83.5 kg)  ?10/14/20 183 lb (83 kg)  ?  ?GEN: Well nourished, well developed in no acute distress ?HEENT: Normal, moist mucous membranes ?NECK: No JVD ?CARDIAC: regular rhythm with occasional early beat, normal S1 and S2, no rubs or gallops. No  murmur. ?VASCULAR: Radial and DP pulses 2+ bilaterally. No carotid bruits ?RESPIRATORY:  Clear to auscultation without rales, wheezing or rhonchi  ?ABDOMEN: Soft, non-tender, non-distended ?MUSCULOSKELETAL:  Ambulates independently ?SKIN: Warm and dry, no edema ?NEUROLOGIC:  Alert and oriented x 3. No focal neuro deficits noted. ?PSYCHIATRIC:  Normal affect   ? ?ASSESSMENT:   ? ?No diagnosis found. ? ? ?PLAN:   ? ?History of peripartum cardiomyopathy ?History of Covid ?-echo noted EF as 45-50%, but frequent PVCs limited interpretation ?-euvolemic, asymptomatic ? ?Palpitations, improved ?PVCs ?-monitor showed PVC burden of 13% ?-started metoprolol with symptomatic improvement ? ?Hypertension: ?-has been elevated at home, even when headache not present ?-will start valsartan, discussed home BP monitoring ? ?Cardiac risk counseling and prevention recommendations: has family history of heart disease ?-recommend heart healthy/Mediterranean diet, with whole grains, fruits, vegetable, fish, lean meats, nuts, and olive oil. Limit salt. ?-recommend moderate walking, 3-5 times/week for 30-50 minutes each session. Aim for at least 150 minutes.week. Goal should be pace of 3 miles/hours, or walking 1.5 miles in 30  minutes ?-recommend avoidance of tobacco products. Avoid excess alcohol. ?-ASCVD risk score: ?The 10-year ASCVD risk score (Arnett DK, et al., 2019) is: 3.6% ?  Values used to calculate the score: ?    Age: 52 year

## 2022-02-27 ENCOUNTER — Encounter (HOSPITAL_BASED_OUTPATIENT_CLINIC_OR_DEPARTMENT_OTHER): Payer: Self-pay | Admitting: Cardiology

## 2022-02-27 ENCOUNTER — Ambulatory Visit (HOSPITAL_BASED_OUTPATIENT_CLINIC_OR_DEPARTMENT_OTHER): Payer: Medicaid Other | Admitting: Cardiology

## 2022-02-27 VITALS — BP 102/62 | HR 62 | Ht 66.5 in | Wt 169.5 lb

## 2022-02-27 DIAGNOSIS — Z79899 Other long term (current) drug therapy: Secondary | ICD-10-CM | POA: Diagnosis not present

## 2022-02-27 DIAGNOSIS — R42 Dizziness and giddiness: Secondary | ICD-10-CM | POA: Diagnosis not present

## 2022-02-27 DIAGNOSIS — R002 Palpitations: Secondary | ICD-10-CM | POA: Insufficient documentation

## 2022-02-27 DIAGNOSIS — Z7189 Other specified counseling: Secondary | ICD-10-CM

## 2022-02-27 DIAGNOSIS — R079 Chest pain, unspecified: Secondary | ICD-10-CM

## 2022-02-27 DIAGNOSIS — I959 Hypotension, unspecified: Secondary | ICD-10-CM | POA: Diagnosis not present

## 2022-02-27 DIAGNOSIS — I493 Ventricular premature depolarization: Secondary | ICD-10-CM

## 2022-02-27 DIAGNOSIS — Z8679 Personal history of other diseases of the circulatory system: Secondary | ICD-10-CM

## 2022-02-27 NOTE — Progress Notes (Signed)
Cardiology Office Note:    Date:  02/27/2022   ID:  Tanya Fry, DOB [birth year], MRN [id number]  PCP:  Nolene Ebbs, MD  Cardiologist:  Buford Dresser, MD  Referring MD: Nolene Ebbs, MD   CC: Follow-up  History of Present Illness:    Tanya Fry is a 51 y.o. female with a hx of hypertension, postpartum cardiomyopathy, Covid infection who is seen for follow-up. I initially met her 12/18/2020 as a new consult at the request of Nolene Ebbs, MD for the evaluation and management of history of heart failure.   History: Had gastric sleeve surgery several years ago, had not required meds since that time. However BP continued to rise late 2022-early 2023 and she was restarted on medication.   Today: Her main concern today are episodes of dizziness and separate episodes of chest pain with onset about 3-4 weeks ago.  About 4 weeks ago she initially developed chills, with some diarrhea as well. Then, 3 weeks ago she developed episodes of dizziness associated with feeling like she was going to pass out. She denies full loss of consciousness. Her appetite is slightly decreased. At this time her chills and diarrhea have largely resolved.  Her chest pain is intermittent and localized to her left chest, sometimes radiating deep to her back and associated with nausea. Typical duration is less than 10 minutes. It seems like her pain is occurring daily, with some exacerbation from moving her LUE today. She also complains of some pain in the back of her neck.  Prior to 4 weeks ago her blood pressure was averaging in the 92'Z or 30'Q systolic. She is 102/62 in clinic today.  She denies any palpitations, shortness of breath, or peripheral edema. No headaches, orthopnea, or PND.   Past Medical History:  Diagnosis Date   Anemia    Hypertension    CHTN, no current meds   Postpartum cardiomyopathy     Past Surgical History:  Procedure Laterality Date   CERVICAL CERCLAGE      GASTRECTOMY     NO PAST SURGERIES     TUBAL LIGATION Bilateral 10/03/2013   Procedure: POST PARTUM TUBAL LIGATION;  Surgeon: Osborne Oman, MD;  Location: [facility];  Service: Gynecology;  Laterality: Bilateral;    Current Medications: Current Outpatient Medications on File Prior to Visit  Medication Sig   Cholecalciferol (VITAMIN D3) 2000 units capsule Take 1 capsule by mouth daily.   metoprolol succinate (TOPROL-XL) 25 MG 24 hr tablet TAKE 1 TABLET (25 MG TOTAL) BY MOUTH DAILY.   ondansetron (ZOFRAN) 4 MG tablet Take 1 tablet (4 mg total) by mouth every 8 (eight) hours as needed for nausea or vomiting.   valACYclovir (VALTREX) 1000 MG tablet Take 1 tablet (1,000 mg total) by mouth 3 (three) times daily.   No current facility-administered medications on file prior to visit.     Allergies:   Shellfish allergy   Social History   Tobacco Use   Smoking status: Every Day    Packs/day: 0.50    Types: Cigarettes   Smokeless tobacco: Never  Vaping Use   Vaping Use: Never used  Substance Use Topics   Alcohol use: No   Drug use: No    Family History: family history includes Cancer in her maternal grandmother and paternal grandfather; Heart failure (age of onset: 45) in her father. Father died 4 years ago from heart failure. Teena Irani had MI in her 61s and died. Mat Gpa died of MI.  ROS:   Please see the history of present illness. (+) Chest pain (+) Back pain (+) Neck pain (+) Dizziness (+) Near syncope (+) Loss of appetite All other systems are reviewed and negative.    EKGs/Labs/Other Studies Reviewed:    The following studies were reviewed today:  Monitor 01/30/2021: Patch Wear Time:  3 days and 7 hours    Patient had a min HR of 63 bpm, max HR of 150 bpm, and avg HR of 90 bpm. Predominant underlying rhythm was Sinus Rhythm. 1 run of Ventricular Tachycardia occurred lasting 4 beats with a max rate of 138 bpm (avg 136 bpm). 1 run of Supraventricular Tachycardia occurred  lasting 6 beats with a max rate of 150 bpm (avg 138 bpm). Isolated SVEs were rare (<1.0%), andisolated VEs were frequent (13.4%).  Echo 01/09/2021:  1. Frequent PVCs makes EF assessment difficult. LVEF appears mildly  reduced ~45-50%.   2. Left ventricular ejection fraction, by estimation, is 45 to 50%. The  left ventricle has mildly decreased function. The left ventricle  demonstrates global hypokinesis. The left ventricular internal cavity size  was mildly dilated. Left ventricular  diastolic parameters are consistent with Grade I diastolic dysfunction  (impaired relaxation).   3. Right ventricular systolic function is normal. The right ventricular  size is normal. Tricuspid regurgitation signal is inadequate for assessing  PA pressure.   4. The mitral valve is grossly normal. Mild mitral valve regurgitation.  No evidence of mitral stenosis.   5. The aortic valve is tricuspid. Aortic valve regurgitation is not  visualized. No aortic stenosis is present.   6. The inferior vena cava is normal in size with greater than 50%  respiratory variability, suggesting right atrial pressure of 3 mmHg.   Comparison(s): Prior images unable to be directly viewed, comparison made  by report only. Changes from prior study are noted. The left ventricular  function is worsened.   Echo 10/15/2013 - Left ventricle: The cavity size was normal. Wall thickness    was normal. Systolic function was normal. The estimated    ejection fraction was in the range of 55% to 60%. Wall    motion was normal; there were no regional wall motion    abnormalities. Doppler parameters are consistent with    abnormal left ventricular relaxation (grade 1 diastolic    dysfunction). The E/e' ratio is >10, suggesting elevated    LV filling pressure.  - Mitral valve: Mildly thickened leaflets . Mild    regurgitation.  - Left atrium: The atrium wasmoderately dilated (35 ml/m2).  - Right atrium: The atrium was mildly dilated (19  cm2).  - Inferior vena cava: The vessel was normal in size; the    respirophasic diameter changes were in the normal range (=    50%); findings are consistent with normal central venous    pressure.  - Pericardium, extracardiac: There was no pericardial    effusion.   EKG:  EKG is personally reviewed.   02/27/2022: not ordered today 07/23/2021: not ordered 12/18/2020: sinus rhythm with PVCs in quadrigeminy, rate 82 bpm  Recent Labs: 05/28/2021: ALT 10; BUN 9; Creatinine, Ser 0.76; Hemoglobin 12.8; Platelets 213; Potassium 3.7; Sodium 137   Recent Lipid Panel    Component Value Date/Time   CHOL 152 12/18/2020 0919   TRIG 81 12/18/2020 0919   HDL 80 12/18/2020 0919   CHOLHDL 1.9 12/18/2020 0919   LDLCALC 57 12/18/2020 0919    Physical Exam:    VS:  BP 102/62  Pulse 62   Ht 5' 6.5" (1.689 m)   Wt 169 lb 8 oz (76.9 kg)   BMI 26.95 kg/m     Wt Readings from Last 3 Encounters:  02/27/22 169 lb 8 oz (76.9 kg)  07/23/21 171 lb 6.4 oz (77.7 kg)  12/18/20 184 lb (83.5 kg)    GEN: Well nourished, well developed in no acute distress HEENT: Normal, moist mucous membranes NECK: No JVD CARDIAC: regular rhythm with occasional early beat, normal S1 and S2, no rubs or gallops. No murmur. VASCULAR: Radial and DP pulses 2+ bilaterally. No carotid bruits RESPIRATORY:  Clear to auscultation without rales, wheezing or rhonchi  ABDOMEN: Soft, non-tender, non-distended MUSCULOSKELETAL:  Ambulates independently; Muscle knots of upper back/neck SKIN: Warm and dry, no edema NEUROLOGIC:  Alert and oriented x 3. No focal neuro deficits noted. PSYCHIATRIC:  Normal affect    ASSESSMENT:    1. Episodic lightheadedness   2. Medication management   3. Hypotension, unspecified hypotension type   4. Heart palpitations   5. PVC (premature ventricular contraction)   6. Chest pain of uncertain etiology   7. Cardiac risk counseling   8. History of cardiomyopathy      PLAN:    Hypertension, now  with hypotension and lightheadedness -stop chlorthalidone, valsartan -monitor BP -encouraged hydration -continue metoprolol for now, if lightheadedness/hypotension remains may need to consider changes  Palpitations, improved PVCs -monitor showed PVC burden of 13% -started metoprolol with symptomatic improvement  History of peripartum cardiomyopathy History of Covid -echo noted EF as 45-50%, but frequent PVCs limited interpretation -euvolemic, asymptomatic  Cardiac risk counseling and prevention recommendations: has family history of heart disease -recommend heart healthy/Mediterranean diet, with whole grains, fruits, vegetable, fish, lean meats, nuts, and olive oil. Limit salt. -recommend moderate walking, 3-5 times/week for 30-50 minutes each session. Aim for at least 150 minutes.week. Goal should be pace of 3 miles/hours, or walking 1.5 miles in 30 minutes -recommend avoidance of tobacco products. Avoid excess alcohol. -ASCVD risk score: The 10-year ASCVD risk score (Arnett DK, et al., 2019) is: 1.9%   Values used to calculate the score:     Age: 40 years     Sex: Female     Is Non-Hispanic African American: Yes     Diabetic: No     Tobacco smoker: Yes     Systolic Blood Pressure: 696 mmHg     Is BP treated: Yes     HDL Cholesterol: 80 mg/dL     Total Cholesterol: 152 mg/dL    Plan for follow up: 3 months or sooner as needed  Buford Dresser, MD, PhD, [facility]    Medication Adjustments/Labs and Tests Ordered: Current medicines are reviewed at length with the patient today.  Concerns regarding medicines are outlined above.   Orders Placed This Encounter  Procedures   Basic metabolic panel    No orders of the defined types were placed in this encounter.   Patient Instructions  Medication Instructions:  STOP TAKING: Chlorthalidone 25 mg daily                            Valsartan 40 mg daily  *If you need a refill on your cardiac  medications before your next appointment, please call your pharmacy*   Lab Work: Your provider has recommended lab work today (BMP). Please have this collected at [facility] [place]. The lab is open 8:00 am -  4:30 pm. Please avoid 12:00p - 1:00p for lunch hour. You do not need an appointment. Please go to [address] [place], [place] [address]. This is in the Primary Care office on the 3rd floor, let them know you are there for blood work and they will direct you to the lab.  If you have labs (blood work) drawn today and your tests are completely normal, you will receive your results only by: [place] (if you have MyChart) OR A paper copy in the mail If you have any lab test that is abnormal or we need to change your treatment, we will call you to review the results.   Testing/Procedures: None ordered today   Follow-Up: At [facility], you and your health needs are our priority.  As part of our continuing mission to provide you with exceptional heart care, we have created designated Provider Care Teams.  These Care Teams include your primary Cardiologist (physician) and Advanced Practice Providers (APPs -  Physician Assistants and Nurse Practitioners) who all work together to provide you with the care you need, when you need it.  We recommend signing up for the patient portal called "MyChart".  Sign up information is provided on this After Visit Summary.  MyChart is used to connect with patients for Virtual Visits (Telemedicine).  Patients are able to view lab/test results, encounter notes, upcoming appointments, etc.  Non-urgent messages can be sent to your provider as well.   To learn more about what you can do with MyChart, go to [email or web address].    Your next appointment:   3 month(s)  The format for your next appointment:   In Person  Provider:   Buford Dresser, MD{   Important Information About Sugar          I,Mathew Stumpf,acting as a scribe for Buford Dresser, MD.,have documented all relevant documentation on the behalf of Buford Dresser, MD,as directed by  Buford Dresser, MD while in the presence of Buford Dresser, MD.  I, Buford Dresser, MD, have reviewed all documentation for this visit. The documentation on 02/27/22 for the exam, diagnosis, procedures, and orders are all accurate and complete.   Signed, Buford Dresser, MD PhD 02/27/2022     [place]

## 2022-02-27 NOTE — Patient Instructions (Signed)
Medication Instructions:  STOP TAKING: Chlorthalidone 25 mg daily                            Valsartan 40 mg daily  *If you need a refill on your cardiac medications before your next appointment, please call your pharmacy*   Lab Work: Your provider has recommended lab work today (BMP). Please have this collected at [facility] at [place]. The lab is open 8:00 am - 4:30 pm. Please avoid 12:00p - 1:00p for lunch hour. You do not need an appointment. Please go to [address] [place], [place] [address]. This is in the Primary Care office on the 3rd floor, let them know you are there for blood work and they will direct you to the lab.  If you have labs (blood work) drawn today and your tests are completely normal, you will receive your results only by: [place] (if you have MyChart) OR A paper copy in the mail If you have any lab test that is abnormal or we need to change your treatment, we will call you to review the results.   Testing/Procedures: None ordered today   Follow-Up: At [facility], you and your health needs are our priority.  As part of our continuing mission to provide you with exceptional heart care, we have created designated Provider Care Teams.  These Care Teams include your primary Cardiologist (physician) and Advanced Practice Providers (APPs -  Physician Assistants and Nurse Practitioners) who all work together to provide you with the care you need, when you need it.  We recommend signing up for the patient portal called "MyChart".  Sign up information is provided on this After Visit Summary.  MyChart is used to connect with patients for Virtual Visits (Telemedicine).  Patients are able to view lab/test results, encounter notes, upcoming appointments, etc.  Non-urgent messages can be sent to your provider as well.   To learn more about what you can do with MyChart, go to [email or web address].    Your next appointment:   3  month(s)  The format for your next appointment:   In Person  Provider:   Buford Dresser, MD{   Important Information About Sugar

## 2022-02-28 LAB — BASIC METABOLIC PANEL
BUN/Creatinine Ratio: 18 (ref 9–23)
BUN: 12 mg/dL (ref 6–24)
CO2: 27 mmol/L (ref 20–29)
Calcium: 9.1 mg/dL (ref 8.7–10.2)
Chloride: 104 mmol/L (ref 96–106)
Creatinine, Ser: 0.66 mg/dL (ref 0.57–1.00)
Glucose: 81 mg/dL (ref 70–99)
Potassium: 3.7 mmol/L (ref 3.5–5.2)
Sodium: 145 mmol/L — ABNORMAL HIGH (ref 134–144)
eGFR: 105 mL/min/{1.73_m2} (ref >59)

## 2022-05-25 ENCOUNTER — Encounter (HOSPITAL_BASED_OUTPATIENT_CLINIC_OR_DEPARTMENT_OTHER): Payer: Self-pay | Admitting: *Deleted

## 2022-06-01 ENCOUNTER — Ambulatory Visit (HOSPITAL_BASED_OUTPATIENT_CLINIC_OR_DEPARTMENT_OTHER): Payer: Medicaid Other | Admitting: Cardiology

## 2022-12-13 ENCOUNTER — Other Ambulatory Visit (HOSPITAL_BASED_OUTPATIENT_CLINIC_OR_DEPARTMENT_OTHER): Payer: Self-pay | Admitting: Cardiology

## 2022-12-13 DIAGNOSIS — I10 Essential (primary) hypertension: Secondary | ICD-10-CM

## 2023-02-19 ENCOUNTER — Encounter (HOSPITAL_BASED_OUTPATIENT_CLINIC_OR_DEPARTMENT_OTHER): Payer: Self-pay | Admitting: Cardiology

## 2023-02-19 ENCOUNTER — Ambulatory Visit (HOSPITAL_BASED_OUTPATIENT_CLINIC_OR_DEPARTMENT_OTHER): Payer: Medicaid Other | Admitting: Cardiology

## 2023-02-19 VITALS — BP 128/90 | HR 77 | Ht 66.5 in | Wt 170.7 lb

## 2023-02-19 DIAGNOSIS — Z8759 Personal history of other complications of pregnancy, childbirth and the puerperium: Secondary | ICD-10-CM

## 2023-02-19 DIAGNOSIS — Z8679 Personal history of other diseases of the circulatory system: Secondary | ICD-10-CM | POA: Diagnosis not present

## 2023-02-19 DIAGNOSIS — O903 Peripartum cardiomyopathy: Secondary | ICD-10-CM

## 2023-02-19 DIAGNOSIS — Z7189 Other specified counseling: Secondary | ICD-10-CM

## 2023-02-19 DIAGNOSIS — R600 Localized edema: Secondary | ICD-10-CM

## 2023-02-19 DIAGNOSIS — I493 Ventricular premature depolarization: Secondary | ICD-10-CM

## 2023-02-19 MED ORDER — METOPROLOL SUCCINATE ER 25 MG PO TB24: 25.0000 mg | ORAL_TABLET | Freq: Every day | ORAL | 3 refills | Status: DC

## 2023-02-19 NOTE — Progress Notes (Signed)
Cardiology Office Note:    Date:  02/19/2023   ID:  Tanya Fry, DOB 03-Oct-1971, MRN [id number]  PCP:  Fleet Contras, MD  Cardiologist:  Jodelle Red, MD  Referring MD: Fleet Contras, MD   CC: Follow-up  History of Present Illness:    Tanya Fry is a 52 y.o. female with a hx of hypertension, postpartum cardiomyopathy, Covid infection who is seen for follow-up. I initially met her 12/18/2020 as a new consult at the request of Fleet Contras, MD for the evaluation and management of history of heart failure.   History: Had gastric sleeve surgery several years ago, had not required meds since that time. However BP continued to rise late 2022-early 2023 and she was restarted on medication.   At her last visit she complained of episodes of dizziness and separate episodes of chest pain with onset about 3-4 weeks prior. Initially had chills, some diarrhea. Later developed dizziness feeling like she would pass out; denied full loss of consciousness. Her chest pain was occurring daily for less than 10 minutes, localized to left chest, sometimes radiating deep to her back and associated with nausea. Prior to onset of her symptoms, blood pressure averaged in systolic 80's or 40'J; was 102/62 in clinic. We stopped her chlorthalidone and valsartan, continued metoprolol. Encouraged hydration.   Today, she reports she has been out of medication for a few months. She believes her recent swelling is subsequent to being off her regimen. She complains of persistent LE swelling, worse in her right LE and worse by the end of the day. She sleeps on either side at night. Sometimes her swelling feels so tight as to cause some tingling/painful sensations. She was also aware that despite her swelling she had no associated shortness of breath or exercise intolerance.  She has been unable to tell if her palpitations are worse off the metoprolol.  Has been on Wellbutrin for about 2 months, initially  started for her weight. She does not have many pills left, and she planned to stop her Wellbutrin as it seems to be ineffective. Discussed with her to talk with her PCP and make sure Wellbutrin doesn't need to be weaned.  In the past month, her most strenuous activity was cleaning a house. She didn't experience any significant symptoms until the following day, when her whole body was sore as if she had been to the gym.  She denies any chest pain, lightheadedness, headaches, syncope, orthopnea, or PND.   Past Medical History:  Diagnosis Date   Anemia    Hypertension    CHTN, no current meds   Postpartum cardiomyopathy     Past Surgical History:  Procedure Laterality Date   CERVICAL CERCLAGE     GASTRECTOMY     NO PAST SURGERIES     TUBAL LIGATION Bilateral 10/03/2013   Procedure: POST PARTUM TUBAL LIGATION;  Surgeon: Tereso Newcomer, MD;  Location: [facility];  Service: Gynecology;  Laterality: Bilateral;    Current Medications: Current Outpatient Medications on File Prior to Visit  Medication Sig   buPROPion (WELLBUTRIN XL) 150 MG 24 hr tablet Take 150 mg by mouth daily.   Cholecalciferol (VITAMIN D3) 2000 units capsule Take 1 capsule by mouth daily.   naltrexone (DEPADE) 50 MG tablet Take 25 mg by mouth daily.   valACYclovir (VALTREX) 1000 MG tablet Take 1 tablet (1,000 mg total) by mouth 3 (three) times daily.   No current facility-administered medications on file prior to visit.  Allergies:   Shellfish allergy   Social History   Tobacco Use   Smoking status: Every Day    Packs/day: .5    Types: Cigarettes   Smokeless tobacco: Never  Vaping Use   Vaping Use: Never used  Substance Use Topics   Alcohol use: No   Drug use: No    Family History: family history includes Cancer in her maternal grandmother and paternal grandfather; Heart failure (age of onset: 23) in her father. Father died 4 years ago from heart failure. Griselda Miner had MI in her 17s and died. Mat Gpa died  of MI.  ROS:   Please see the history of present illness. (+) LE swelling (R>L) (+) Palpitations All other systems are reviewed and negative.    EKGs/Labs/Other Studies Reviewed:    The following studies were reviewed today:  Monitor 01/30/2021: Patch Wear Time:  3 days and 7 hours    Patient had a min HR of 63 bpm, max HR of 150 bpm, and avg HR of 90 bpm. Predominant underlying rhythm was Sinus Rhythm. 1 run of Ventricular Tachycardia occurred lasting 4 beats with a max rate of 138 bpm (avg 136 bpm). 1 run of Supraventricular Tachycardia occurred lasting 6 beats with a max rate of 150 bpm (avg 138 bpm). Isolated SVEs were rare (<1.0%), andisolated VEs were frequent (13.4%).  Echo 01/09/2021:  1. Frequent PVCs makes EF assessment difficult. LVEF appears mildly  reduced ~45-50%.   2. Left ventricular ejection fraction, by estimation, is 45 to 50%. The  left ventricle has mildly decreased function. The left ventricle  demonstrates global hypokinesis. The left ventricular internal cavity size  was mildly dilated. Left ventricular  diastolic parameters are consistent with Grade I diastolic dysfunction  (impaired relaxation).   3. Right ventricular systolic function is normal. The right ventricular  size is normal. Tricuspid regurgitation signal is inadequate for assessing  PA pressure.   4. The mitral valve is grossly normal. Mild mitral valve regurgitation.  No evidence of mitral stenosis.   5. The aortic valve is tricuspid. Aortic valve regurgitation is not  visualized. No aortic stenosis is present.   6. The inferior vena cava is normal in size with greater than 50%  respiratory variability, suggesting right atrial pressure of 3 mmHg.   Comparison(s): Prior images unable to be directly viewed, comparison made  by report only. Changes from prior study are noted. The left ventricular  function is worsened.   Echo 10/15/2013 - Left ventricle: The cavity size was normal. Wall  thickness    was normal. Systolic function was normal. The estimated    ejection fraction was in the range of 55% to 60%. Wall    motion was normal; there were no regional wall motion    abnormalities. Doppler parameters are consistent with    abnormal left ventricular relaxation (grade 1 diastolic    dysfunction). The E/e' ratio is >10, suggesting elevated    LV filling pressure.  - Mitral valve: Mildly thickened leaflets . Mild    regurgitation.  - Left atrium: The atrium wasmoderately dilated (35 ml/m2).  - Right atrium: The atrium was mildly dilated (19 cm2).  - Inferior vena cava: The vessel was normal in size; the    respirophasic diameter changes were in the normal range (=    50%); findings are consistent with normal central venous    pressure.  - Pericardium, extracardiac: There was no pericardial    effusion.   EKG:  EKG is  personally reviewed.   02/19/2023:  NSR at 77 bpm 02/27/2022: not ordered 07/23/2021: not ordered 12/18/2020: sinus rhythm with PVCs in quadrigeminy, rate 82 bpm  Recent Labs: 02/27/2022: BUN 12; Creatinine, Ser 0.66; Potassium 3.7; Sodium 145   Recent Lipid Panel    Component Value Date/Time   CHOL 152 12/18/2020 0919   TRIG 81 12/18/2020 0919   HDL 80 12/18/2020 0919   CHOLHDL 1.9 12/18/2020 0919   LDLCALC 57 12/18/2020 0919    Physical Exam:    VS:  BP (!) 128/90 (BP Location: Right Arm, Patient Position: Sitting, Cuff Size: Normal)   Pulse 77   Ht 5' 6.5" (1.689 m)   Wt 170 lb 11.2 oz (77.4 kg)   BMI 27.14 kg/m     Wt Readings from Last 3 Encounters:  02/19/23 170 lb 11.2 oz (77.4 kg)  02/27/22 169 lb 8 oz (76.9 kg)  07/23/21 171 lb 6.4 oz (77.7 kg)    GEN: Well nourished, well developed in no acute distress HEENT: Normal, moist mucous membranes NECK: No JVD CARDIAC: regular rhythm with occasional early beat, normal S1 and S2, no rubs or gallops. No murmur. VASCULAR: Radial and DP pulses 2+ bilaterally. No carotid  bruits RESPIRATORY:  Clear to auscultation without rales, wheezing or rhonchi  ABDOMEN: Soft, non-tender, non-distended MUSCULOSKELETAL:  Ambulates independently SKIN: Warm and dry, bilateral minimal LE edema NEUROLOGIC:  Alert and oriented x 3. No focal neuro deficits noted. PSYCHIATRIC:  Normal affect    ASSESSMENT:    1. Bilateral leg edema   2. PVC (premature ventricular contraction)   3. History of cardiomyopathy   4. Peripartum cardiomyopathy   5. Cardiac risk counseling     PLAN:    Mild LE edema -echo in 3 mos if not improved -discussed salt, compression, elevation  Hypertension -previously stopped chlorthalidone and ARB for hypotension  Palpitations, improved PVCs -monitor showed PVC burden of 13% -started metoprolol with symptomatic improvement, has been out for several months, restart and monitor symptoms -F/u wellbutrin--stimulant for weight--planning to stop, talk to PCP  History of peripartum cardiomyopathy History of Covid -echo noted EF as 45-50%, but frequent PVCs limited interpretation -euvolemic, asymptomatic  Cardiac risk counseling and prevention recommendations: has family history of heart disease -recommend heart healthy/Mediterranean diet, with whole grains, fruits, vegetable, fish, lean meats, nuts, and olive oil. Limit salt. -recommend moderate walking, 3-5 times/week for 30-50 minutes each session. Aim for at least 150 minutes.week. Goal should be pace of 3 miles/hours, or walking 1.5 miles in 30 minutes -recommend avoidance of tobacco products. Avoid excess alcohol. -ASCVD risk score: The 10-year ASCVD risk score (Arnett DK, et al., 2019) is: 4.7%   Values used to calculate the score:     Age: 2 years     Sex: Female     Is Non-Hispanic African American: Yes     Diabetic: No     Tobacco smoker: Yes     Systolic Blood Pressure: 128 mmHg     Is BP treated: Yes     HDL Cholesterol: 80 mg/dL     Total Cholesterol: 152 mg/dL    Plan for  follow up: 3 months or sooner as needed  Jodelle Red, MD, PhD, [facility]  [facility]    Medication Adjustments/Labs and Tests Ordered: Current medicines are reviewed at length with the patient today.  Concerns regarding medicines are outlined above.   Orders Placed This Encounter  Procedures   EKG 12-Lead   Meds ordered this  encounter  Medications   metoprolol succinate (TOPROL-XL) 25 MG 24 hr tablet    Sig: Take 1 tablet (25 mg total) by mouth daily.    Dispense:  90 tablet    Refill:  3   Patient Instructions  Medication Instructions:  RESTART METOPROLOL SUCC 25 MG DAILY   *If you need a refill on your cardiac medications before your next appointment, please call your pharmacy*  Lab Work: NONE  Testing/Procedures: NONE  Follow-Up: At [facility], you and your health needs are our priority.  As part of our continuing mission to provide you with exceptional heart care, we have created designated Provider Care Teams.  These Care Teams include your primary Cardiologist (physician) and Advanced Practice Providers (APPs -  Physician Assistants and Nurse Practitioners) who all work together to provide you with the care you need, when you need it.  We recommend signing up for the patient portal called "MyChart".  Sign up information is provided on this After Visit Summary.  MyChart is used to connect with patients for Virtual Visits (Telemedicine).  Patients are able to view lab/test results, encounter notes, upcoming appointments, etc.  Non-urgent messages can be sent to your provider as well.   To learn more about what you can do with MyChart, go to [email or web address].    Your next appointment:   3 month(s)  The format for your next appointment:   In Person  Provider:   Jodelle Red, MD       [facility] Stumpf,acting as a scribe for Jodelle Red, MD.,have documented all relevant documentation on the behalf of Jodelle Red, MD,as directed by  Jodelle Red, MD while in the presence of Jodelle Red, MD.  I, Jodelle Red, MD, have reviewed all documentation for this visit. The documentation on 02/19/23 for the exam, diagnosis, procedures, and orders are all accurate and complete.   Signed, Jodelle Red, MD PhD 02/19/2023     [facility]

## 2023-02-19 NOTE — Patient Instructions (Addendum)
Medication Instructions:  RESTART METOPROLOL SUCC 25 MG DAILY   *If you need a refill on your cardiac medications before your next appointment, please call your pharmacy*  Lab Work: NONE  Testing/Procedures: NONE  Follow-Up: At [facility], you and your health needs are our priority.  As part of our continuing mission to provide you with exceptional heart care, we have created designated Provider Care Teams.  These Care Teams include your primary Cardiologist (physician) and Advanced Practice Providers (APPs -  Physician Assistants and Nurse Practitioners) who all work together to provide you with the care you need, when you need it.  We recommend signing up for the patient portal called "MyChart".  Sign up information is provided on this After Visit Summary.  MyChart is used to connect with patients for Virtual Visits (Telemedicine).  Patients are able to view lab/test results, encounter notes, upcoming appointments, etc.  Non-urgent messages can be sent to your provider as well.   To learn more about what you can do with MyChart, go to [email or web address].    Your next appointment:   3 month(s)  The format for your next appointment:   In Person  Provider:   Jodelle Red, MD

## 2023-05-24 ENCOUNTER — Ambulatory Visit (HOSPITAL_BASED_OUTPATIENT_CLINIC_OR_DEPARTMENT_OTHER): Payer: Medicaid Other | Admitting: Cardiology

## 2023-05-31 ENCOUNTER — Encounter (HOSPITAL_BASED_OUTPATIENT_CLINIC_OR_DEPARTMENT_OTHER): Payer: Self-pay | Admitting: Cardiology

## 2023-05-31 ENCOUNTER — Ambulatory Visit (HOSPITAL_BASED_OUTPATIENT_CLINIC_OR_DEPARTMENT_OTHER): Payer: Medicaid Other | Admitting: Cardiology

## 2023-05-31 VITALS — BP 128/80 | HR 82 | Ht 66.5 in | Wt 159.9 lb

## 2023-05-31 DIAGNOSIS — Z8759 Personal history of other complications of pregnancy, childbirth and the puerperium: Secondary | ICD-10-CM | POA: Diagnosis not present

## 2023-05-31 DIAGNOSIS — O903 Peripartum cardiomyopathy: Secondary | ICD-10-CM

## 2023-05-31 DIAGNOSIS — I493 Ventricular premature depolarization: Secondary | ICD-10-CM

## 2023-05-31 DIAGNOSIS — I10 Essential (primary) hypertension: Secondary | ICD-10-CM

## 2023-05-31 DIAGNOSIS — Z8679 Personal history of other diseases of the circulatory system: Secondary | ICD-10-CM

## 2023-05-31 NOTE — Patient Instructions (Signed)

## 2023-05-31 NOTE — Progress Notes (Signed)
Cardiology Office Note:  .    Date:  05/31/2023  ID:  Tanya Fry, DOB 27-Jan-1971, MRN [id number] PCP: Fleet Contras, MD  [facility] Providers Cardiologist:  Jodelle Red, MD     History of Present Illness: .    Tanya Fry is a 52 y.o. female with a hx of hypertension, postpartum cardiomyopathy, Covid infection who is seen for follow-up. I initially met her 12/18/2020 as a new consult at the request of Fleet Contras, MD for the evaluation and management of history of heart failure.    History: Had gastric sleeve surgery several years ago, had not required meds since that time. However BP continued to rise late 2022-early 2023 and she was restarted on medication.    In 02/2022, she complained of episodes of dizziness and separate episodes of chest pain with onset about 3-4 weeks prior. Initially had chills, some diarrhea. Later developed dizziness feeling like she would pass out; denied full loss of consciousness. Her chest pain was occurring daily for less than 10 minutes, localized to left chest, sometimes radiating deep to her back and associated with nausea. Prior to onset of her symptoms, blood pressure averaged in systolic 80's or 61'Y; was 102/62 in clinic. We stopped her chlorthalidone and valsartan, continued metoprolol. Encouraged hydration.    At her visit 02/2023 she complained of persistent LE swelling in the setting of being out of her medication for a few months. We discussed salt, compression, elevation. Planned for echocardiogram in 3 months if her symptoms did not improve. She had been unable to tell if her palpitations were worse off the metoprolol. Metoprolol was restarted.   Today, she states she is feeling fine aside from her elevated blood pressure today at 159/94 which is abnormal for her. On manual recheck, her blood pressure is 128/80. She has not monitored her home readings recently.  Additionally she complains of some fatigue which she  attributes to her work on third shift. No recent issues with leg swelling.   She denies any palpitations, chest pain, shortness of breath, lightheadedness, headaches, syncope, orthopnea, or PND.  ROS:  Please see the history of present illness. ROS otherwise negative except as noted.  (+) Fatigue  Studies Reviewed: Marland Kitchen       No new today  Physical Exam:    VS:  BP 128/80 (BP Location: Right Arm, Patient Position: Sitting, Cuff Size: Normal)   Pulse 82   Ht 5' 6.5" (1.689 m)   Wt 159 lb 14.4 oz (72.5 kg)   BMI 25.42 kg/m    Wt Readings from Last 3 Encounters:  05/31/23 159 lb 14.4 oz (72.5 kg)  02/19/23 170 lb 11.2 oz (77.4 kg)  02/27/22 169 lb 8 oz (76.9 kg)    GEN: Well nourished, well developed in no acute distress HEENT: Normal, moist mucous membranes NECK: No JVD CARDIAC: regular rhythm, normal S1 and S2, no rubs or gallops. No murmur. VASCULAR: Radial and DP pulses 2+ bilaterally. No carotid bruits RESPIRATORY:  Clear to auscultation without rales, wheezing or rhonchi  ABDOMEN: Soft, non-tender, non-distended MUSCULOSKELETAL:  Ambulates independently SKIN: Warm and dry, no edema NEUROLOGIC:  Alert and oriented x 3. No focal neuro deficits noted. PSYCHIATRIC:  Normal affect   ASSESSMENT AND PLAN: .    Mild LE edema -resolved -discussed salt, compression, elevation   Hypertension -previously stopped chlorthalidone and ARB for hypotension -initially elevated BP, much improved on recheck. No change to meds today   Palpitations, improved PVCs -monitor  showed PVC burden of 13% -continue metoprolol   History of peripartum cardiomyopathy History of Covid -echo noted EF as 45-50%, but frequent PVCs limited interpretation -euvolemic, asymptomatic   Cardiac risk counseling and prevention recommendations: has family history of heart disease -recommend heart healthy/Mediterranean diet, with whole grains, fruits, vegetable, fish, lean meats, nuts, and olive oil. Limit  salt. -recommend moderate walking, 3-5 times/week for 30-50 minutes each session. Aim for at least 150 minutes.week. Goal should be pace of 3 miles/hours, or walking 1.5 miles in 30 minutes -recommend avoidance of tobacco products. Avoid excess alcohol. The 10-year ASCVD risk score (Arnett DK, et al., 2019) is: 4.7%   Values used to calculate the score:     Age: 59 years     Sex: Female     Is Non-Hispanic African American: Yes     Diabetic: No     Tobacco smoker: Yes     Systolic Blood Pressure: 128 mmHg     Is BP treated: Yes     HDL Cholesterol: 80 mg/dL     Total Cholesterol: 152 mg/dL   Dispo: Follow-up in 1 year, or sooner as needed.  I,Mathew Stumpf,acting as a Neurosurgeon for [employer], MD.,have documented all relevant documentation on the behalf of Jodelle Red, MD,as directed by  Jodelle Red, MD while in the presence of Jodelle Red, MD.  I, Jodelle Red, MD, have reviewed all documentation for this visit. The documentation on 05/31/23 for the exam, diagnosis, procedures, and orders are all accurate and complete.   Signed, Jodelle Red, MD

## 2023-07-06 ENCOUNTER — Other Ambulatory Visit (HOSPITAL_BASED_OUTPATIENT_CLINIC_OR_DEPARTMENT_OTHER): Payer: Self-pay | Admitting: Cardiology

## 2024-04-05 ENCOUNTER — Other Ambulatory Visit (HOSPITAL_BASED_OUTPATIENT_CLINIC_OR_DEPARTMENT_OTHER): Payer: Self-pay | Admitting: Cardiology

## 2024-04-05 DIAGNOSIS — I493 Ventricular premature depolarization: Secondary | ICD-10-CM

## 2024-04-08 ENCOUNTER — Other Ambulatory Visit: Payer: Self-pay

## 2024-04-08 ENCOUNTER — Emergency Department (HOSPITAL_BASED_OUTPATIENT_CLINIC_OR_DEPARTMENT_OTHER)
Admission: EM | Admit: 2024-04-08 | Discharge: 2024-04-08 | Disposition: A | Discharge status: Home / Self Care | Attending: Emergency Medicine | Admitting: Emergency Medicine

## 2024-04-08 ENCOUNTER — Encounter (HOSPITAL_BASED_OUTPATIENT_CLINIC_OR_DEPARTMENT_OTHER): Payer: Self-pay | Admitting: Emergency Medicine

## 2024-04-08 DIAGNOSIS — I509 Heart failure, unspecified: Secondary | ICD-10-CM | POA: Diagnosis not present

## 2024-04-08 DIAGNOSIS — H5712 Ocular pain, left eye: Secondary | ICD-10-CM | POA: Diagnosis present

## 2024-04-08 DIAGNOSIS — B0052 Herpesviral keratitis: Secondary | ICD-10-CM | POA: Diagnosis not present

## 2024-04-08 DIAGNOSIS — I110 Hypertensive heart disease with heart failure: Secondary | ICD-10-CM | POA: Diagnosis not present

## 2024-04-08 DIAGNOSIS — F17210 Nicotine dependence, cigarettes, uncomplicated: Secondary | ICD-10-CM | POA: Diagnosis not present

## 2024-04-08 HISTORY — DX: Herpesviral keratitis: B00.52

## 2024-04-08 HISTORY — DX: Other chorioretinal inflammations, left eye: H30.892

## 2024-04-08 MED ORDER — TETRACAINE HCL 0.5 % OP SOLN
2.0000 [drp] | Freq: Once | OPHTHALMIC | Status: DC
  Filled 2024-04-08: qty 4

## 2024-04-08 MED ORDER — FLUORESCEIN SODIUM 1 MG OP STRP
1.0000 | ORAL_STRIP | Freq: Once | OPHTHALMIC | Status: DC
  Filled 2024-04-08: qty 1

## 2024-04-08 MED ORDER — SULFACETAMIDE-PREDNISOLONE 10-0.23 % OP SOLN: 1.0000 [drp] | OPHTHALMIC | 0 refills | Status: AC

## 2024-04-08 NOTE — ED Triage Notes (Signed)
 Pt has been having left eye irritation for a few days.  Drainage, and crusting per patient.  Some eye redness.  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 

## 2024-04-08 NOTE — ED Provider Notes (Signed)
 [place] [facility] Provider Note  CSN: [id number] Arrival date & time: 04/08/24 9271  Chief Complaint(s) Eye Problem  HPI Tanya Fry is a 53 y.o. female here today for left eye pain.  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  She reports over the last 2 days she has been having increasing pain and crusting of that left eye.  No difficulty with her vision.   Past Medical History Past Medical History:  Diagnosis Date   Anemia    Hypertension    CHTN, no current meds   Keratitis, herpetic    Postpartum cardiomyopathy    Serpiginous choroiditis of left eye    Patient Active Problem List   Diagnosis Date Noted   Heart palpitations 02/27/2022   PVC (premature ventricular contraction) 02/27/2022   History of cardiomyopathy 02/27/2022   Obesity (BMI 30-39.9) 11/30/2016   Essential hypertension 02/19/2016   Hypokalemia 10/15/2013   Acute exacerbation of CHF (congestive heart failure) (HCC) 10/14/2013   Peripartum cardiomyopathy 10/14/2013   Malignant hypertension 10/14/2013   SVD (spontaneous vaginal delivery) 10/03/2013   Encounter for sterilization 10/03/2013   Pregnancy 10/03/2013   Abnormal fetal ultrasound 10/02/2013   Cardiomyopathy, peripartum--postpartum following birth of 2nd child 08/10/2013   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 Authorizing Provider  sulfacetamide-prednisoLONE (VASOCIDIN) 10-0.23 % ophthalmic solution Place 1 drop into the left eye every 3 (three)  hours while awake for 7 days. 04/08/24 04/15/24 Yes Mannie Pac T, DO  Cholecalciferol (VITAMIN D3) 2000 units capsule Take 1 capsule by mouth daily.    [provider]  metoprolol  succinate (TOPROL -XL) 25 MG 24 hr tablet TAKE 1 TABLET (25 MG TOTAL) BY MOUTH DAILY. 04/07/24   Lonni Slain, MD  valACYclovir  (VALTREX ) 1000 MG tablet Take 1 tablet (1,000 mg total) by mouth 3 (three) times daily. 04/08/21   Brien Therisa MATSU, MD                                                                                                                                    Past Surgical History Past Surgical History:  Procedure Laterality Date   CERVICAL CERCLAGE     GASTRECTOMY     NO PAST SURGERIES     TUBAL LIGATION Bilateral 10/03/2013   Procedure: POST PARTUM TUBAL LIGATION;  Surgeon: Gloris DELENA Hugger, MD;  Location: [facility];  Service: Gynecology;  Laterality: Bilateral;   Family History Family History  Problem Relation Age of Onset   Cancer Maternal Grandmother  breast cancer   Cancer Paternal Grandfather        prostate   Heart failure Father 45    Social History Social History   Tobacco Use   Smoking status: Every Day    Current packs/day: 0.50    Types: Cigarettes   Smokeless tobacco: Never  Vaping Use   Vaping status: Never Used  Substance Use Topics   Alcohol  use: No   Drug use: No   Allergies Shellfish allergy  Review of Systems Review of Systems  Physical Exam Vital Signs  I have reviewed the triage vital signs BP (!) 156/96 (BP Location: Left Arm)   Pulse 78   Temp 98 F (36.7 C) (Oral)   Resp 20   Ht 5' 6.5 (1.689 m)   Wt 77.1 kg   LMP  (LMP Unknown)   SpO2 100%   BMI 27.03 kg/m   Physical Exam Vitals and nursing note reviewed.   Eyes:     Extraocular Movements: Extraocular movements intact.     Pupils: Pupils are equal, round, and reactive to light.     Comments: Left eye-mild conjunctival erythema.  Under fluorescein  staining, no  ulcerations, no keratotic lesions.  Intraocular pressure 15 with 95% accuracy.  Visual acuity grossly normal in the left eye.  Lids everted, no foreign bodies.   Skin:    Findings: No rash.   Neurological:     General: No focal deficit present.     Mental Status: She is alert.     ED Results and Treatments Labs (all labs ordered are listed, but only abnormal results are displayed) Labs Reviewed - No data to display                                                                                                                        Radiology No results found.  Pertinent labs & imaging results that were available during my care of the patient were reviewed by me and considered in my medical decision making (see MDM for details).  Medications Ordered in ED Medications  tetracaine  (PONTOCAINE) 0.5 % ophthalmic solution 2 drop (has no administration in time range)  fluorescein  ophthalmic strip 1 strip (has no administration in time range)  Procedures Procedures  (including critical care time)  Medical Decision Making / ED Course   This patient presents to the ED for concern of eye pain, this involves an extensive number of treatment options, and is a complaint that carries with it a high risk of complications and morbidity.  The differential diagnosis includes HSV keratitis, angle-closure glaucoma, corneal abrasion.  MDM: Patient without any intraocular pressure elevation, normal visual acuity.  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  She has begun taking her Valtrex  starting a couple days ago.  Will provide her with some methylprednisolone drops.  She is following up with her ophthalmologist in a few days.  Return precautions discussed with patient at bedside.   Additional history obtained:  -External records from outside  source obtained and reviewed including: Chart review including previous notes, labs, imaging, consultation notes   Lab Tests: -I ordered, reviewed, and interpreted labs.   The pertinent results include:   Labs Reviewed - No data to display     Medicines ordered and prescription drug management: Meds ordered this encounter  Medications   tetracaine  (PONTOCAINE) 0.5 % ophthalmic solution 2 drop   fluorescein  ophthalmic strip 1 strip   sulfacetamide-prednisoLONE (VASOCIDIN) 10-0.23 % ophthalmic solution    Sig: Place 1 drop into the left eye every 3 (three) hours while awake for 7 days.    Dispense:  2.1 mL    Refill:  0    -I have reviewed the patients home medicines and have made adjustments as needed  Social Determinants of Health:  Factors impacting patients care include: Lack of access to primary care   Reevaluation: After the interventions noted above, I reevaluated the patient and found that they have :improved  Co morbidities that complicate the patient evaluation  Past Medical History:  Diagnosis Date   Anemia    Hypertension    CHTN, no current meds   Keratitis, herpetic    Postpartum cardiomyopathy    Serpiginous choroiditis of left eye         Final Clinical Impression(s) / ED Diagnoses Final diagnoses:  Keratitis, herpetic     @PCDICTATION @    Mannie Pac T, DO 04/08/24 [phone]

## 2024-04-08 NOTE — Discharge Instructions (Addendum)
 I have sent you a prescription for steroid eyedrops.  This medication is a bit stronger than the other eyedrop that you have been prescribed previously, but they work similarly so just this 1 eyedrop should work a little bit better.  You can use the numbing eyedrops 2 times per day.  Call your ophthalmologist on Monday to make an appointment.  Return to the emergency room if you develop difficulty with your vision or worsening pain.

## 2024-08-02 ENCOUNTER — Encounter (HOSPITAL_BASED_OUTPATIENT_CLINIC_OR_DEPARTMENT_OTHER): Payer: Self-pay

## 2024-08-04 ENCOUNTER — Ambulatory Visit (HOSPITAL_BASED_OUTPATIENT_CLINIC_OR_DEPARTMENT_OTHER): Admitting: Cardiology

## 2024-08-04 ENCOUNTER — Encounter (HOSPITAL_BASED_OUTPATIENT_CLINIC_OR_DEPARTMENT_OTHER): Payer: Self-pay | Admitting: Cardiology

## 2024-08-04 VITALS — BP 114/68 | HR 50 | Ht 66.5 in | Wt 150.3 lb

## 2024-08-04 DIAGNOSIS — Z8679 Personal history of other diseases of the circulatory system: Secondary | ICD-10-CM | POA: Diagnosis not present

## 2024-08-04 DIAGNOSIS — I493 Ventricular premature depolarization: Secondary | ICD-10-CM

## 2024-08-04 DIAGNOSIS — Z7189 Other specified counseling: Secondary | ICD-10-CM | POA: Diagnosis not present

## 2024-08-04 DIAGNOSIS — I10 Essential (primary) hypertension: Secondary | ICD-10-CM | POA: Diagnosis not present

## 2024-08-04 MED ORDER — METOPROLOL SUCCINATE ER 25 MG PO TB24: 25.0000 mg | ORAL_TABLET | Freq: Every day | ORAL | 3 refills | Status: AC

## 2024-08-04 NOTE — Progress Notes (Signed)
 Cardiology Office Note:  .    Date:  08/04/2024  ID:  Tanya Fry, DOB 1971/02/27, MRN [id number] PCP: Tanya Atlas, MD  [facility] Providers Cardiologist:  Tanya Bruckner, MD     History of Present Illness: .    Tanya Fry is a 53 y.o. female with a hx of hypertension and then hypotension, postpartum cardiomyopathy, who is seen for follow-up. I initially met her 12/18/2020 as a new consult at the request of Tanya Atlas, MD for the evaluation and management of history of heart failure.    History: Had gastric sleeve surgery several years ago, had not required meds since that time. However BP continued to rise late 2022-early 2023 and she was restarted on medication.    In 02/2022, she complained of episodes of dizziness and separate episodes of chest pain with onset about 3-4 weeks prior. Initially had chills, some diarrhea. Later developed dizziness feeling like she would pass out; denied full loss of consciousness. Her chest pain was occurring daily for less than 10 minutes, localized to left chest, sometimes radiating deep to her back and associated with nausea. Prior to onset of her symptoms, blood pressure averaged in systolic 80's or 09'd; was 102/62 in clinic. We stopped her chlorthalidone  and valsartan , continued metoprolol . Encouraged hydration.    Today: Has been having occasional headaches, wonders if her blood pressure is high with these episodes. Only thing new she has noticed is that the left side of her head/scalp/neck/ear has been sore and tender to palpation. Does have a broken/infected tooth on the left side. No fevers/chills.  Has had some palpitations but has been taking metoprolol , symptoms are infrequent. Improved when she cut back on caffeine . Otherwise she is not on any other hypertension meds.  Drinks alcohol  occasionally (about 3x/month). She is not smoking for the last two months. Did noticed that alcohol  is a significant trigger for her  palpations.   ROS:  Denies shortness of breath at rest or with normal exertion. No PND, orthopnea, LE edema or unexpected weight gain. No syncope. ROS otherwise negative except as noted.   Studies Reviewed: SABRA    EKG Interpretation Date/Time:  Friday August 04 2024 11:09:27 EDT Ventricular Rate:  80 PR Interval:  168 QRS Duration:  86 QT Interval:  384 QTC Calculation: 442 R Axis:   51  Text Interpretation: Normal sinus rhythm Normal ECG Confirmed by Bruckner Tanya [phone]) on 08/04/2024 11:24:11 AM    Physical Exam:    VS:  BP 114/68 (Cuff Size: Normal)   Pulse (!) 50   Ht 5' 6.5 (1.689 m)   Wt 150 lb 4.8 oz (68.2 kg)   LMP  (LMP Unknown)   SpO2 98%   BMI 23.90 kg/m    Wt Readings from Last 3 Encounters:  08/04/24 150 lb 4.8 oz (68.2 kg)  04/08/24 170 lb (77.1 kg)  05/31/23 159 lb 14.4 oz (72.5 kg)    GEN: Well nourished, well developed in no acute distress HEENT: Normal, moist mucous membranes NECK: No JVD CARDIAC: regular rhythm, normal S1 and S2, no rubs or gallops. No murmur. VASCULAR: Radial and DP pulses 2+ bilaterally. No carotid bruits RESPIRATORY:  Clear to auscultation without rales, wheezing or rhonchi  ABDOMEN: Soft, non-tender, non-distended MUSCULOSKELETAL:  Ambulates independently SKIN: Warm and dry, no edema NEUROLOGIC:  Alert and oriented x 3. No focal neuro deficits noted. PSYCHIATRIC:  Normal affect   ASSESSMENT AND PLAN: .    Hypertension -previously stopped chlorthalidone  and  ARB for hypotension -initially elevated BP, much improved on recheck. No change to meds today   Palpitations, improved PVCs -monitor showed PVC burden of 13% -continue metoprolol  succinate 25 mg daily. Limited by bradycardia   History of peripartum cardiomyopathy -echo noted EF as 45-50%, but frequent PVCs limited interpretation -euvolemic, asymptomatic   Cardiac risk counseling and prevention recommendations: has family history of heart disease -recommend  heart healthy/Mediterranean diet, with whole grains, fruits, vegetable, fish, lean meats, nuts, and olive oil. Limit salt. -recommend moderate walking, 3-5 times/week for 30-50 minutes each session. Aim for at least 150 minutes.week. Goal should be pace of 3 miles/hours, or walking 1.5 miles in 30 minutes -recommend avoidance of tobacco products. Avoid excess alcohol .  Last lipids were well controlled in 2022, no history of CAD, would just follow periodically (at least every 5 years)  Dispo: Follow-up in 1 year, or sooner as needed.  Signed, Tanya Bruckner, MD

## 2024-08-04 NOTE — Patient Instructions (Addendum)
 I'm not sure if the headaches are causing the blood pressure to be high or if you are having headaches because of the blood pressure. You can check blood pressure at home (both when you are having headaches and when you feel good) to see if there is a pattern. If tylenol  makes the headache better, you can see if your blood pressure goes down too--this would point to the headaches being the main issue.   Medication Instructions:  Your physician recommends that you continue on your current medications as directed. Please refer to the Current Medication list given to you today.  *If you need a refill on your cardiac medications before your next appointment, please call your pharmacy*  Lab Work: none If you have labs (blood work) drawn today and your tests are completely normal, you will receive your results only by: MyChart Message (if you have MyChart) OR A paper copy in the mail If you have any lab test that is abnormal or we need to change your treatment, we will call you to review the results.  Testing/Procedures: none  Follow-Up: At [facility], you and your health needs are our priority.  As part of our continuing mission to provide you with exceptional heart care, our providers are all part of one team.  This team includes your primary Cardiologist (physician) and Advanced Practice Providers or APPs (Physician Assistants and Nurse Practitioners) who all work together to provide you with the care you need, when you need it.  Your next appointment:   12 month(s)  Provider:   Shelda Bruckner, MD    We recommend signing up for the patient portal called MyChart.  Sign up information is provided on this After Visit Summary.  MyChart is used to connect with patients for Virtual Visits (Telemedicine).  Patients are able to view lab/test results, encounter notes, upcoming appointments, etc.  Non-urgent messages can be sent to your provider as well.   To learn more about what  you can do with MyChart, go to [email or web address].   Other Instructions

## 2024-10-02 ENCOUNTER — Encounter (HOSPITAL_BASED_OUTPATIENT_CLINIC_OR_DEPARTMENT_OTHER): Payer: Self-pay | Admitting: Emergency Medicine

## 2024-10-02 ENCOUNTER — Other Ambulatory Visit: Payer: Self-pay

## 2024-10-02 ENCOUNTER — Emergency Department (HOSPITAL_BASED_OUTPATIENT_CLINIC_OR_DEPARTMENT_OTHER)
Admission: EM | Admit: 2024-10-02 | Discharge: 2024-10-02 | Disposition: A | Discharge status: Home / Self Care | Attending: Emergency Medicine | Admitting: Emergency Medicine

## 2024-10-02 DIAGNOSIS — I10 Essential (primary) hypertension: Secondary | ICD-10-CM | POA: Diagnosis not present

## 2024-10-02 DIAGNOSIS — Z206 Contact with and (suspected) exposure to human immunodeficiency virus [HIV]: Secondary | ICD-10-CM | POA: Diagnosis present

## 2024-10-02 LAB — COMPREHENSIVE METABOLIC PANEL WITH GFR
ALT: 13 U/L (ref 0–44)
AST: 20 U/L (ref 15–41)
Albumin: 4.4 g/dL (ref 3.5–5.0)
Alkaline Phosphatase: 63 U/L (ref 38–126)
Anion gap: 12 (ref 5–15)
BUN: 13 mg/dL (ref 6–20)
CO2: 26 mmol/L (ref 22–32)
Calcium: 9.3 mg/dL (ref 8.9–10.3)
Chloride: 105 mmol/L (ref 98–111)
Creatinine, Ser: 0.71 mg/dL (ref 0.44–1.00)
GFR, Estimated: >60 mL/min
Glucose, Bld: 78 mg/dL (ref 70–99)
Potassium: 3.9 mmol/L (ref 3.5–5.1)
Sodium: 142 mmol/L (ref 135–145)
Total Bilirubin: 0.5 mg/dL (ref 0.0–1.2)
Total Protein: 7.2 g/dL (ref 6.5–8.1)

## 2024-10-02 LAB — CBC WITH DIFFERENTIAL/PLATELET
Abs Immature Granulocytes: 0.01 K/uL (ref 0.00–0.07)
Basophils Absolute: 0 K/uL (ref 0.0–0.1)
Basophils Relative: 1 %
Eosinophils Absolute: 0.2 K/uL (ref 0.0–0.5)
Eosinophils Relative: 3 %
HCT: 38.5 % (ref 36.0–46.0)
Hemoglobin: 13.2 g/dL (ref 12.0–15.0)
Immature Granulocytes: 0 %
Lymphocytes Relative: 32 %
Lymphs Abs: 2 K/uL (ref 0.7–4.0)
MCH: 31.3 pg (ref 26.0–34.0)
MCHC: 34.3 g/dL (ref 30.0–36.0)
MCV: 91.2 fL (ref 80.0–100.0)
Monocytes Absolute: 0.4 K/uL (ref 0.1–1.0)
Monocytes Relative: 7 %
Neutro Abs: 3.5 K/uL (ref 1.7–7.7)
Neutrophils Relative %: 57 %
Platelets: 277 K/uL (ref 150–400)
RBC: 4.22 MIL/uL (ref 3.87–5.11)
RDW: 13.1 % (ref 11.5–15.5)
WBC: 6.2 K/uL (ref 4.0–10.5)
nRBC: 0 % (ref 0.0–0.2)

## 2024-10-02 LAB — HIV ANTIBODY (ROUTINE TESTING W REFLEX): HIV Screen 4th Generation wRfx: NONREACTIVE

## 2024-10-02 MED ORDER — BICTEGRAVIR-EMTRICITAB-TENOFOV 50-200-25 MG PO PREPACK
1.0000 | ORAL_TABLET | Freq: Every day | ORAL | Status: DC
  Administered 2024-10-02: 1 via ORAL
  Filled 2024-10-02: qty 1

## 2024-10-02 MED ORDER — BICTEGRAVIR-EMTRICITAB-TENOFOV 50-200-25 MG PO TABS: 1.0000 | ORAL_TABLET | Freq: Every day | ORAL | 0 refills | Status: AC

## 2024-10-02 NOTE — ED Provider Notes (Signed)
 " [place] [facility] Provider Note   CSN: [id number] Arrival date & time: 10/02/24  1810     Patient presents with: No chief complaint on file.   Tanya Fry is a 53 y.o. female  53 year old female presents ED for concerns of self sticking herself with a lancet while helping her neighbor with diabetes check his glucose levels.  She reports he has HIV and is taking Biktarvy.  Patient advises she accidentally stuck her right thumb during the incident and bled quite a bit.  She immediately came to the ED after.  Patient reports significant history of hypertension and anemia which is managed by PCP.    Prior to Admission medications  Medication Sig Start Date End Date Taking? Authorizing Provider  bictegravir-emtricitabine -tenofovir  AF (BIKTARVY) 50-200-25 MG TABS tablet Take 1 tablet by mouth daily. 10/02/24 11/01/24 Yes Myriam Fonda RAMAN, PA-C  Calcium Citrate-Vitamin D 315-5 MG-MCG TABS Take 1 tablet by mouth.    [provider]  Cholecalciferol (VITAMIN D3) 2000 units capsule Take 1 capsule by mouth daily.    [provider]  cyanocobalamin (VITAMIN B12) 1000 MCG tablet Take 1,000 mcg by mouth.    [provider]  metoprolol  succinate (TOPROL -XL) 25 MG 24 hr tablet Take 1 tablet (25 mg total) by mouth daily. 08/04/24   Lonni Slain, MD  valACYclovir  (VALTREX ) 1000 MG tablet Take 1 tablet (1,000 mg total) by mouth 3 (three) times daily. 04/08/21   Brien Therisa MATSU, MD    Allergies: Shellfish allergy    Review of Systems  Skin:  Positive for wound.  All other systems reviewed and are negative.   Updated Vital Signs BP (!) 166/102   Pulse 95   Temp 99.1 F (37.3 C)   Resp 16   LMP  (LMP Unknown)   SpO2 100%   Physical Exam Vitals and nursing note reviewed.  Constitutional:      Appearance: Normal appearance.  HENT:     Head: Normocephalic and atraumatic.     Nose: Nose normal.  Eyes:      Extraocular Movements: Extraocular movements intact.     Conjunctiva/sclera: Conjunctivae normal.     Pupils: Pupils are equal, round, and reactive to light.  Cardiovascular:     Rate and Rhythm: Normal rate.  Pulmonary:     Effort: Pulmonary effort is normal. No respiratory distress.  Musculoskeletal:        General: Normal range of motion.     Cervical back: Normal range of motion.     Comments: Small area of redness were lancet pierced her skin on her right thumb.  No active bleeding.  Neurological:     General: No focal deficit present.     Mental Status: She is alert.  Psychiatric:        Mood and Affect: Mood normal.        Behavior: Behavior normal.     (all labs ordered are listed, but only abnormal results are displayed) Labs Reviewed  CBC WITH DIFFERENTIAL/PLATELET  COMPREHENSIVE METABOLIC PANEL WITH GFR  HIV ANTIBODY (ROUTINE TESTING W REFLEX)  HEPATITIS PANEL, ACUTE    EKG: None  Radiology: No results found.   Procedures   Medications Ordered in the ED  bictegravir-emtricitabine -tenofovir  AF (BIKTARVY) 50-200-25 MG Prepack 1 each (1 each Oral Provided for home use 10/02/24 2032)    53 y.o. female presents to the ED with complaints of HIV exposure through lancet needle while helping neighbor.  On  arrival pt is nontoxic, vitals remarkable for hypertension patient advises she is diagnosed with hypertension and is managed by PCP.SABRA Exam is unremarkable.  I ordered medication Biktarvy for HIV exposure  Lab Tests:  CMP CBC HIV and hepatitis. CMP and CBC unremarkable HIV and hepatitis pending at discharge.  Patient was advised how to check results and advised to recheck in 2 to 4 weeks.  ED Course:   Patient sitting comfortably in ED bed no acute distress nontoxic-appearing.  Patient denies any symptoms and denies any complaints.  Patient is requesting prophylactic treatment HIV after accidental lancet stick while helping neighbor.  Patient was started on  Biktarvy in ED and prescribed 1 month supply.  It was advised the patient to follow-up with PCP to get rechecked in 2 to 4 weeks.  If she had any new concerning symptoms return to the ED for further evaluation.  Patient agreed to treatment plan and is comfortable discharge at this time.   Portions of this note were generated with Scientist, clinical (histocompatibility and immunogenetics). Dictation errors may occur despite best attempts at proofreading.   Final diagnoses:  HIV exposure    ED Discharge Orders          Ordered    bictegravir-emtricitabine -tenofovir  AF (BIKTARVY) 50-200-25 MG TABS tablet  Daily        10/02/24 2031               Jaylan Duggar S, PA-C 10/02/24 2102    Patt Alm Macho, MD 10/02/24 2310  "

## 2024-10-02 NOTE — ED Triage Notes (Signed)
 Pt reports she went to help friend w/ [facility] and stuck herself w/ his needle & reports he is HIV positive. She reports she wants HIV testing.

## 2024-10-02 NOTE — Discharge Instructions (Addendum)
 Please take the [facility] as prescribed.  I have sent in a Biktarvy pack to your pharmacy.  It is very important to follow-up with your primary care physician in 2 to 4 weeks to be rechecked.  Please take Biktarvy as prescribed for the full course.  If you experience any concerning or new symptoms please return to the ED for further evaluation.

## 2024-10-03 LAB — HEPATITIS PANEL, ACUTE
HCV Ab: NONREACTIVE
Hep A IgM: NONREACTIVE
Hep B C IgM: NONREACTIVE
Hepatitis B Surface Ag: NONREACTIVE
